# Patient Record
Sex: Male | Born: 1940 | Race: Black or African American | Hispanic: No | Marital: Married | Smoking: Former smoker
Health system: Southern US, Community
[De-identification: ages and names within clinical notes are randomized; demographics above are authoritative.]

## PROBLEM LIST (undated history)

## (undated) DIAGNOSIS — I209 Angina pectoris, unspecified: Secondary | ICD-10-CM

## (undated) DIAGNOSIS — I10 Essential (primary) hypertension: Secondary | ICD-10-CM

## (undated) DIAGNOSIS — I639 Cerebral infarction, unspecified: Secondary | ICD-10-CM

## (undated) DIAGNOSIS — E785 Hyperlipidemia, unspecified: Secondary | ICD-10-CM

## (undated) DIAGNOSIS — I2510 Atherosclerotic heart disease of native coronary artery without angina pectoris: Secondary | ICD-10-CM

## (undated) SURGERY — Surgical Case
Anesthesia: *Unknown

---

## 2010-10-15 ENCOUNTER — Emergency Department (HOSPITAL_COMMUNITY)
Admission: EM | Admit: 2010-10-15 | Discharge: 2010-10-16 | Disposition: A | Discharge status: Home / Self Care | Payer: Medicare Other | Attending: Emergency Medicine | Admitting: Emergency Medicine

## 2010-10-15 DIAGNOSIS — R109 Unspecified abdominal pain: Secondary | ICD-10-CM | POA: Insufficient documentation

## 2010-10-15 DIAGNOSIS — Z79899 Other long term (current) drug therapy: Secondary | ICD-10-CM | POA: Insufficient documentation

## 2010-10-15 DIAGNOSIS — E119 Type 2 diabetes mellitus without complications: Secondary | ICD-10-CM | POA: Insufficient documentation

## 2010-10-15 DIAGNOSIS — I10 Essential (primary) hypertension: Secondary | ICD-10-CM | POA: Insufficient documentation

## 2010-10-15 DIAGNOSIS — Z87891 Personal history of nicotine dependence: Secondary | ICD-10-CM | POA: Insufficient documentation

## 2010-10-15 LAB — URINALYSIS, ROUTINE W REFLEX MICROSCOPIC
Protein, ur: NEGATIVE mg/dL
Urobilinogen, UA: 1 mg/dL (ref 0.0–1.0)

## 2010-10-15 LAB — URINE MICROSCOPIC-ADD ON

## 2010-10-16 ENCOUNTER — Emergency Department (HOSPITAL_COMMUNITY): Payer: Medicare Other

## 2010-10-16 LAB — COMPREHENSIVE METABOLIC PANEL
Alkaline Phosphatase: 56 U/L (ref 39–117)
BUN: 13 mg/dL (ref 6–23)
GFR calc Af Amer: >60 mL/min (ref >60)
GFR calc non Af Amer: >60 mL/min (ref >60)
Glucose, Bld: 127 mg/dL — ABNORMAL HIGH (ref 70–99)
Potassium: 3.6 mEq/L (ref 3.5–5.1)
Total Protein: 7.3 g/dL (ref 6.0–8.3)

## 2010-10-16 LAB — POCT I-STAT, CHEM 8
BUN: 14 mg/dL (ref 6–23)
Creatinine, Ser: 0.9 mg/dL (ref 0.50–1.35)
Potassium: 3.6 mEq/L (ref 3.5–5.1)
Sodium: 137 mEq/L (ref 135–145)

## 2010-10-16 LAB — LIPASE, BLOOD: Lipase: 43 U/L (ref 11–59)

## 2010-10-16 LAB — DIFFERENTIAL
Basophils Absolute: 0 10*3/uL (ref 0.0–0.1)
Lymphocytes Relative: 33 % (ref 12–46)
Monocytes Absolute: 1.3 10*3/uL — ABNORMAL HIGH (ref 0.1–1.0)
Monocytes Relative: 24 % — ABNORMAL HIGH (ref 3–12)
Neutro Abs: 2.3 10*3/uL (ref 1.7–7.7)

## 2010-10-16 LAB — CBC
HCT: 44.1 % (ref 39.0–52.0)
Hemoglobin: 14.4 g/dL (ref 13.0–17.0)
MCH: 27.2 pg (ref 26.0–34.0)
MCHC: 32.7 g/dL (ref 30.0–36.0)

## 2010-10-16 MED ORDER — IOHEXOL 300 MG/ML  SOLN
100.0000 mL | Freq: Once | INTRAMUSCULAR | Status: AC | PRN
  Administered 2010-10-16: 100 mL via INTRAVENOUS

## 2011-02-26 ENCOUNTER — Other Ambulatory Visit: Payer: Self-pay

## 2011-02-26 ENCOUNTER — Emergency Department (HOSPITAL_COMMUNITY): Payer: Medicare Other

## 2011-02-26 ENCOUNTER — Encounter (HOSPITAL_COMMUNITY): Payer: Self-pay | Admitting: Emergency Medicine

## 2011-02-26 ENCOUNTER — Inpatient Hospital Stay (HOSPITAL_COMMUNITY)
Admission: EM | Admit: 2011-02-26 | Discharge: 2011-03-08 | DRG: 234 | Disposition: A | Discharge status: Home Health | Payer: Medicare Other | Attending: Surgery | Admitting: Surgery

## 2011-02-26 DIAGNOSIS — R066 Hiccough: Secondary | ICD-10-CM | POA: Diagnosis not present

## 2011-02-26 DIAGNOSIS — Z79899 Other long term (current) drug therapy: Secondary | ICD-10-CM

## 2011-02-26 DIAGNOSIS — E119 Type 2 diabetes mellitus without complications: Secondary | ICD-10-CM | POA: Diagnosis present

## 2011-02-26 DIAGNOSIS — I214 Non-ST elevation (NSTEMI) myocardial infarction: Principal | ICD-10-CM | POA: Diagnosis present

## 2011-02-26 DIAGNOSIS — K5900 Constipation, unspecified: Secondary | ICD-10-CM | POA: Diagnosis not present

## 2011-02-26 DIAGNOSIS — I10 Essential (primary) hypertension: Secondary | ICD-10-CM | POA: Diagnosis present

## 2011-02-26 DIAGNOSIS — I200 Unstable angina: Secondary | ICD-10-CM

## 2011-02-26 DIAGNOSIS — Z8673 Personal history of transient ischemic attack (TIA), and cerebral infarction without residual deficits: Secondary | ICD-10-CM

## 2011-02-26 DIAGNOSIS — I4891 Unspecified atrial fibrillation: Secondary | ICD-10-CM | POA: Diagnosis not present

## 2011-02-26 DIAGNOSIS — I498 Other specified cardiac arrhythmias: Secondary | ICD-10-CM | POA: Diagnosis not present

## 2011-02-26 DIAGNOSIS — I2510 Atherosclerotic heart disease of native coronary artery without angina pectoris: Secondary | ICD-10-CM

## 2011-02-26 DIAGNOSIS — E785 Hyperlipidemia, unspecified: Secondary | ICD-10-CM | POA: Diagnosis present

## 2011-02-26 DIAGNOSIS — Z7982 Long term (current) use of aspirin: Secondary | ICD-10-CM

## 2011-02-26 DIAGNOSIS — R079 Chest pain, unspecified: Secondary | ICD-10-CM

## 2011-02-26 DIAGNOSIS — Z8249 Family history of ischemic heart disease and other diseases of the circulatory system: Secondary | ICD-10-CM

## 2011-02-26 DIAGNOSIS — Z87891 Personal history of nicotine dependence: Secondary | ICD-10-CM

## 2011-02-26 HISTORY — DX: Angina pectoris, unspecified: I20.9

## 2011-02-26 HISTORY — DX: Cerebral infarction, unspecified: I63.9

## 2011-02-26 HISTORY — DX: Essential (primary) hypertension: I10

## 2011-02-26 LAB — URINALYSIS, ROUTINE W REFLEX MICROSCOPIC
Glucose, UA: NEGATIVE mg/dL
Leukocytes, UA: NEGATIVE
Nitrite: NEGATIVE
Protein, ur: NEGATIVE mg/dL
pH: 7 (ref 5.0–8.0)

## 2011-02-26 LAB — CBC
Hemoglobin: 14 g/dL (ref 13.0–17.0)
MCHC: 32.6 g/dL (ref 30.0–36.0)
Platelets: 190 10*3/uL (ref 150–400)
RDW: 13.3 % (ref 11.5–15.5)

## 2011-02-26 LAB — COMPREHENSIVE METABOLIC PANEL
AST: 21 U/L (ref 0–37)
Albumin: 3.6 g/dL (ref 3.5–5.2)
Alkaline Phosphatase: 69 U/L (ref 39–117)
Chloride: 101 mEq/L (ref 96–112)
Potassium: 4.5 mEq/L (ref 3.5–5.1)
Sodium: 137 mEq/L (ref 135–145)
Total Bilirubin: 0.3 mg/dL (ref 0.3–1.2)
Total Protein: 7.5 g/dL (ref 6.0–8.3)

## 2011-02-26 LAB — CARDIAC PANEL(CRET KIN+CKTOT+MB+TROPI): Relative Index: 2.8 — ABNORMAL HIGH (ref 0.0–2.5)

## 2011-02-26 LAB — DIFFERENTIAL
Basophils Absolute: 0 10*3/uL (ref 0.0–0.1)
Basophils Relative: 0 % (ref 0–1)
Monocytes Relative: 14 % — ABNORMAL HIGH (ref 3–12)
Neutro Abs: 3.4 10*3/uL (ref 1.7–7.7)
Neutrophils Relative %: 57 % (ref 43–77)

## 2011-02-26 LAB — POCT I-STAT TROPONIN I: Troponin i, poc: 0.13 ng/mL (ref 0.00–0.08)

## 2011-02-26 MED ORDER — ASPIRIN 81 MG PO CHEW
324.0000 mg | CHEWABLE_TABLET | Freq: Once | ORAL | Status: AC
  Administered 2011-02-26: 324 mg via ORAL
  Filled 2011-02-26: qty 4

## 2011-02-26 MED ORDER — ASPIRIN EC 81 MG PO TBEC
81.0000 mg | DELAYED_RELEASE_TABLET | Freq: Every day | ORAL | Status: DC
  Administered 2011-02-27 – 2011-03-01 (×2): 81 mg via ORAL
  Filled 2011-02-26 (×3): qty 1

## 2011-02-26 MED ORDER — ACETAMINOPHEN 325 MG PO TABS: 650.0000 mg | ORAL_TABLET | ORAL | Status: DC | PRN

## 2011-02-26 MED ORDER — NITROGLYCERIN 0.4 MG SL SUBL: 0.4000 mg | SUBLINGUAL_TABLET | SUBLINGUAL | Status: DC | PRN

## 2011-02-26 MED ORDER — HEPARIN BOLUS VIA INFUSION
4000.0000 [IU] | Freq: Once | INTRAVENOUS | Status: AC
  Administered 2011-02-26: 4000 [IU] via INTRAVENOUS

## 2011-02-26 MED ORDER — METOPROLOL TARTRATE 12.5 MG HALF TABLET
12.5000 mg | ORAL_TABLET | Freq: Two times a day (BID) | ORAL | Status: DC
  Administered 2011-02-26 – 2011-02-28 (×4): 12.5 mg via ORAL
  Filled 2011-02-26 (×7): qty 1

## 2011-02-26 MED ORDER — SIMVASTATIN 20 MG PO TABS
20.0000 mg | ORAL_TABLET | Freq: Every day | ORAL | Status: DC
  Administered 2011-02-27 – 2011-02-28 (×2): 20 mg via ORAL
  Filled 2011-02-26 (×3): qty 1

## 2011-02-26 MED ORDER — ONDANSETRON HCL 4 MG/2ML IJ SOLN: 4.0000 mg | Freq: Four times a day (QID) | INTRAMUSCULAR | Status: DC | PRN

## 2011-02-26 MED ORDER — HEPARIN (PORCINE) IN NACL 100-0.45 UNIT/ML-% IJ SOLN
1000.0000 [IU]/h | INTRAMUSCULAR | Status: DC
  Administered 2011-02-26: 1000 [IU]/h via INTRAVENOUS
  Filled 2011-02-26 (×2): qty 250

## 2011-02-26 NOTE — ED Notes (Signed)
PT states he took one 81 mg ASA this morning. Pt has been in the process of moving and CP started yesterday while picking up items for the move.

## 2011-02-26 NOTE — ED Provider Notes (Signed)
History     CSN: [id number]  Arrival date & time 02/26/11  1919   First MD Initiated Contact with Patient 02/26/11 1951      Chief Complaint  Patient presents with  . Chest Pain  . Abdominal Pain    (Consider location/radiation/quality/duration/timing/severity/associated sxs/prior treatment) Patient is a 71 y.o. male presenting with chest pain. The history is provided by the patient.  Chest Pain The chest pain began 1 - 2 weeks ago. Duration of episode(s) is 30 minutes. Chest pain occurs intermittently. The chest pain is worsening. The pain is associated with exertion. At its most intense, the pain is at 7/10. The pain is currently at 0/10. The severity of the pain is moderate. The quality of the pain is described as aching, dull and heavy. The pain radiates to the epigastrium. Chest pain is worsened by exertion. Primary symptoms include abdominal pain. Pertinent negatives for primary symptoms include no fatigue, no shortness of breath, no cough, no wheezing, no nausea and no vomiting.  Pertinent negatives for associated symptoms include no lower extremity edema. He tried nothing for the symptoms. Risk factors include male gender and smoking/tobacco exposure.  His past medical history is significant for diabetes, hyperlipidemia and hypertension.  His family medical history is significant for heart disease in family.  Procedure history is negative for cardiac catheterization.     Past Medical History  Diagnosis Date  . Diabetes mellitus   . Hypertension   . Stroke     History reviewed. No pertinent past surgical history.  No family history on file.  History  Substance Use Topics  . Smoking status: Former Games developer  . Smokeless tobacco: Not on file  . Alcohol Use:       Review of Systems  Constitutional: Negative for fatigue.  Respiratory: Negative for cough, shortness of breath and wheezing.   Cardiovascular: Positive for chest pain.  Gastrointestinal: Positive for  abdominal pain. Negative for nausea and vomiting.  All other systems reviewed and are negative.    Allergies  Review of patient's allergies indicates no known allergies.  Home Medications   Current Outpatient Rx  Name Route Sig Dispense Refill  . METFORMIN HCL 1000 MG PO TABS Oral Take 1,000 mg by mouth daily.      BP 174/89  Pulse 99  Temp 98.2 F (36.8 C)  Resp 16  SpO2 100%  Physical Exam  Nursing note and vitals reviewed. Constitutional: He is oriented to person, place, and time. He appears well-developed and well-nourished. No distress.  HENT:  Head: Normocephalic and atraumatic.  Mouth/Throat: Oropharynx is clear and moist.  Eyes: Conjunctivae and EOM are normal. Pupils are equal, round, and reactive to light.  Neck: Normal range of motion. Neck supple.  Cardiovascular: Normal rate, regular rhythm and intact distal pulses.   No murmur heard. Pulmonary/Chest: Effort normal and breath sounds normal. No respiratory distress. He has no wheezes. He has no rales.  Abdominal: Soft. He exhibits no distension. There is no tenderness. There is no rebound and no guarding.  Musculoskeletal: Normal range of motion. He exhibits no edema and no tenderness.  Neurological: He is alert and oriented to person, place, and time.  Skin: Skin is warm and dry. No rash noted. No erythema.  Psychiatric: He has a normal mood and affect. His behavior is normal.    ED Course  Procedures (including critical care time)  Labs Reviewed  DIFFERENTIAL - Abnormal; Notable for the following:    Monocytes Relative 14 (*)  All other components within normal limits  COMPREHENSIVE METABOLIC PANEL - Abnormal; Notable for the following:    Glucose, Bld 122 (*)    All other components within normal limits  CARDIAC PANEL(CRET KIN+CKTOT+MB+TROPI) - Abnormal; Notable for the following:    CK, MB 5.3 (*)    Relative Index 2.8 (*)    All other components within normal limits  POCT I-STAT TROPONIN I -  Abnormal; Notable for the following:    Troponin i, poc 0.13 (*)    All other components within normal limits  CBC  URINALYSIS, ROUTINE W REFLEX MICROSCOPIC   Dg Abd Acute W/chest  02/26/2011  *RADIOLOGY REPORT*  Clinical Data: Generalized chest and abdominal pain.  ACUTE ABDOMEN SERIES (ABDOMEN 2 VIEW & CHEST 1 VIEW)  Comparison: None.  Findings: The heart size is at the upper limits of normal.  The lungs are clear.  Supine and upright views the abdomen demonstrates moderate stool throughout the colon.  No obstruction or free air is present.  Degenerative changes are present in the lower lumbar spine.  IMPRESSION:  1.  Borderline cardiomegaly without failure. 2.  Moderate stool throughout the colon. 3.  No acute abnormality of the chest or abdomen.  Original Report Authenticated By: Jamesetta Orleans. MATTERN, M.D.   CRITICAL CARE Performed by: Gwyneth Sprout   Total critical care time: 30  Critical care time was exclusive of separately billable procedures and treating other patients.  Critical care was necessary to treat or prevent imminent or life-threatening deterioration.  Critical care was time spent personally by me on the following activities: development of treatment plan with patient and/or surrogate as well as nursing, discussions with consultants, evaluation of patient's response to treatment, examination of patient, obtaining history from patient or surrogate, ordering and performing treatments and interventions, ordering and review of laboratory studies, ordering and review of radiographic studies, pulse oximetry and re-evaluation of patient's condition.   No diagnosis found.   Date: 02/26/2011  Rate: 89  Rhythm: normal sinus rhythm  QRS Axis: normal  Intervals: normal  ST/T Wave abnormalities: normal  Conduction Disutrbances:none  Narrative Interpretation:   Old EKG Reviewed: none available    MDM   Patient presenting with a story concerning for ACS. He is getting  chest pain when he gets up and exerts himself. Which is in his chest and his abdomen. He denies any associated symptoms and currently is pain-free. No infectious symptoms no abdominal pain and normal exam currently. His EKG is with in normal limits. Chest x-ray within normal limits. CBC, CMP within normal limits. Point-of-care troponin elevated at 0.13 but regular her cardiac markers with an elevated CK-MB of 5.3 but otherwise normal. Given the patient's story concerning for unstable angina. Multiple risks factors concerning for cardiac disease. Patient given aspirin and started on heparin as there are no contraindications to giving blood thinners. Cardiology consult and will admit for further care.       Gwyneth Sprout, MD 02/26/11 2136

## 2011-02-26 NOTE — ED Notes (Signed)
Patient states that chest pain and abdominal pain started yesterday, worse upon exertion.  No shortness of breath, no nausea or vomiting.

## 2011-02-26 NOTE — ED Notes (Signed)
MD at bedside. 

## 2011-02-26 NOTE — Progress Notes (Signed)
ANTICOAGULATION CONSULT NOTE - Initial Consult  Pharmacy Consult for Heparin Indication: chest pain/ACS  No Known Allergies  Patient Measurements: Height: 6' (182.9 cm) Weight: 186 lb 15.2 oz (84.8 kg) IBW/kg (Calculated) : 77.6   Vital Signs: Temp: 98.1 F (36.7 C) (02/02 2332) Temp src: Oral (02/02 2332) BP: 160/91 mmHg (02/02 2332) Pulse Rate: 80  (02/02 2332)  Labs:  Basename 02/26/11 2011 02/26/11 2010  HGB -- 14.0  HCT -- 43.0  PLT -- 190  APTT -- --  LABPROT -- --  INR -- --  HEPARINUNFRC -- --  CREATININE -- 0.69  CKTOTAL 186 --  CKMB 5.3* --  TROPONINI <0.30 --   Estimated Creatinine Clearance: 93 ml/min (by C-G formula based on Cr of 0.69).  Medical History: Past Medical History  Diagnosis Date  . Diabetes mellitus   . Hypertension   . Stroke   . Angina     Medications:  Prescriptions prior to admission  Medication Sig Dispense Refill  . metFORMIN (GLUCOPHAGE) 1000 MG tablet Take 1,000 mg by mouth daily.        Assessment: 71 y.o. Male presents with chest pain. Troponon negative. Pt started heparin gtt in ED with 4000 unit bolus and 1000 units/hr at ~2200  Goal of Therapy:  Heparin level 0.3-0.7 units/ml   Plan:  1. Continue heparin at 1000 units/hr 2. Will  Check 8 hr heparin level 3. Daily heparin level and CBC  Henery Betzold, Hilario Quarry, PharmD, BCPS Clinical pharmacist; pager [phone] 02/26/2011,11:54 PM

## 2011-02-27 ENCOUNTER — Encounter (HOSPITAL_COMMUNITY): Payer: Self-pay | Admitting: Internal Medicine

## 2011-02-27 DIAGNOSIS — R079 Chest pain, unspecified: Secondary | ICD-10-CM

## 2011-02-27 LAB — GLUCOSE, CAPILLARY: Glucose-Capillary: 172 mg/dL — ABNORMAL HIGH (ref 70–99)

## 2011-02-27 LAB — CBC
HCT: 44.1 % (ref 39.0–52.0)
Hemoglobin: 14.1 g/dL (ref 13.0–17.0)
MCH: 27.6 pg (ref 26.0–34.0)
MCV: 86.5 fL (ref 78.0–100.0)
RBC: 5.1 MIL/uL (ref 4.22–5.81)

## 2011-02-27 LAB — PROTIME-INR
INR: 1.05 (ref 0.00–1.49)
Prothrombin Time: 13.9 seconds (ref 11.6–15.2)

## 2011-02-27 LAB — PLATELET INHIBITION P2Y12: Platelet Function  P2Y12: 321 [PRU] (ref 194–418)

## 2011-02-27 LAB — BASIC METABOLIC PANEL
BUN: 8 mg/dL (ref 6–23)
Calcium: 9.2 mg/dL (ref 8.4–10.5)
GFR calc Af Amer: >90 mL/min (ref >90)
GFR calc non Af Amer: >90 mL/min (ref >90)
Glucose, Bld: 182 mg/dL — ABNORMAL HIGH (ref 70–99)
Potassium: 4.1 mEq/L (ref 3.5–5.1)

## 2011-02-27 LAB — LIPID PANEL
Cholesterol: 197 mg/dL (ref 0–200)
LDL Cholesterol: 138 mg/dL — ABNORMAL HIGH (ref 0–99)
Triglycerides: 94 mg/dL (ref <150)
VLDL: 17 mg/dL (ref 0–40)

## 2011-02-27 LAB — CARDIAC PANEL(CRET KIN+CKTOT+MB+TROPI)
CK, MB: 6 ng/mL — ABNORMAL HIGH (ref 0.3–4.0)
CK, MB: 6.3 ng/mL (ref 0.3–4.0)
Troponin I: 0.38 ng/mL (ref <0.30)

## 2011-02-27 LAB — HEMOGLOBIN A1C
Hgb A1c MFr Bld: 5.9 % — ABNORMAL HIGH (ref <5.7)
Mean Plasma Glucose: 123 mg/dL — ABNORMAL HIGH (ref <117)

## 2011-02-27 LAB — APTT: aPTT: 67 seconds — ABNORMAL HIGH (ref 24–37)

## 2011-02-27 MED ORDER — CAPTOPRIL 6.25 MG HALF TABLET
6.2500 mg | ORAL_TABLET | Freq: Three times a day (TID) | ORAL | Status: DC
  Administered 2011-02-27 – 2011-03-01 (×6): 6.25 mg via ORAL
  Filled 2011-02-27 (×10): qty 1

## 2011-02-27 MED ORDER — SODIUM CHLORIDE 0.9 % IJ SOLN: 3.0000 mL | INTRAMUSCULAR | Status: DC | PRN

## 2011-02-27 MED ORDER — SODIUM CHLORIDE 0.9 % IV SOLN
INTRAVENOUS | Status: DC
  Administered 2011-02-28: 75 mL/h via INTRAVENOUS

## 2011-02-27 MED ORDER — HEPARIN (PORCINE) IN NACL 100-0.45 UNIT/ML-% IJ SOLN
1350.0000 [IU]/h | INTRAMUSCULAR | Status: DC
  Administered 2011-02-27 (×2): 1350 [IU]/h via INTRAVENOUS
  Filled 2011-02-27 (×2): qty 250

## 2011-02-27 MED ORDER — HEPARIN BOLUS VIA INFUSION
2500.0000 [IU] | Freq: Once | INTRAVENOUS | Status: AC
  Administered 2011-02-27: 2500 [IU] via INTRAVENOUS
  Filled 2011-02-27: qty 2500

## 2011-02-27 MED ORDER — SODIUM CHLORIDE 0.9 % IJ SOLN
3.0000 mL | Freq: Two times a day (BID) | INTRAMUSCULAR | Status: DC
  Administered 2011-02-27 (×2): 3 mL via INTRAVENOUS

## 2011-02-27 MED ORDER — NITROGLYCERIN IN D5W 200-5 MCG/ML-% IV SOLN
2.0000 ug/min | INTRAVENOUS | Status: DC
  Administered 2011-02-27: 5 ug/min via INTRAVENOUS

## 2011-02-27 MED ORDER — SODIUM CHLORIDE 0.9 % IV SOLN
250.0000 mL | INTRAVENOUS | Status: DC | PRN
  Administered 2011-02-27: 250 mL via INTRAVENOUS

## 2011-02-27 MED ORDER — HEPARIN BOLUS VIA INFUSION
2000.0000 [IU] | Freq: Once | INTRAVENOUS | Status: AC
  Administered 2011-02-27: 2000 [IU] via INTRAVENOUS

## 2011-02-27 MED ORDER — ACTIVE PARTNERSHIP FOR HEALTH OF YOUR HEART BOOK
Freq: Once | Status: AC
  Administered 2011-02-27: 22:00:00
  Filled 2011-02-27: qty 1

## 2011-02-27 MED ORDER — ASPIRIN 81 MG PO CHEW
324.0000 mg | CHEWABLE_TABLET | ORAL | Status: AC
  Administered 2011-02-28: 324 mg via ORAL
  Filled 2011-02-27: qty 4

## 2011-02-27 MED ORDER — DIAZEPAM 5 MG PO TABS
5.0000 mg | ORAL_TABLET | ORAL | Status: AC
  Administered 2011-02-28: 5 mg via ORAL
  Filled 2011-02-27: qty 1

## 2011-02-27 MED ORDER — HEPARIN (PORCINE) IN NACL 100-0.45 UNIT/ML-% IJ SOLN
1650.0000 [IU]/h | INTRAMUSCULAR | Status: DC
  Filled 2011-02-27: qty 250

## 2011-02-27 NOTE — Progress Notes (Signed)
ANTICOAGULATION CONSULT NOTE - Follow Up Consult  Pharmacy Consult for Heparin Indication: chest pain/ACS  No Known Allergies  Patient Measurements: Height: 6' (182.9 cm) Weight: 186 lb 1.1 oz (84.4 kg) IBW/kg (Calculated) : 77.6  Heparin Dosing Weight: 85kg  Vital Signs: Temp: 98.3 F (36.8 C) (02/03 2000) Temp src: Oral (02/03 2000) BP: 121/61 mmHg (02/03 1900) Pulse Rate: 86  (02/03 1900)  Labs:  Basename 02/27/11 2030 02/27/11 0917 02/26/11 2338 02/26/11 2011 02/26/11 2010  HGB -- 14.1 -- -- 14.0  HCT -- 44.1 -- -- 43.0  PLT -- 175 -- -- 190  APTT -- -- 67* -- --  LABPROT -- 14.1 13.9 -- --  INR -- 1.07 1.05 -- --  HEPARINUNFRC 0.16* <0.10* -- -- --  CREATININE -- 0.65 -- -- 0.69  CKTOTAL -- 217 188 186 --  CKMB -- 6.3* 6.0* 5.3* --  TROPONINI -- 0.41* 0.38* <0.30 --   Estimated Creatinine Clearance: 93 ml/min (by C-G formula based on Cr of 0.65).   Medications:  Heparin 1350 units/hr  Assessment: 70yom on heparin for CP awaiting cath tomorrow. Heparin level (0.16) is subtherapeutic but trended up. RN reports no problems with IV/infusion - H/H and Plts wnl - No significant bleeding reported  Goal of Therapy:  Heparin level 0.3-0.7 units/ml   Plan:  1. Heparin IV bolus 2000 units x 1 2. Increase heparin drip to 1650 units/hr (16.5 ml/hr) 3. Check heparin level ~ 8hrs after rate increase (with AM labs)  Cleon Dew [phone] 02/27/2011,9:41 PM

## 2011-02-27 NOTE — Progress Notes (Signed)
CRITICAL VALUE ALERT  Critical value received: troponin 0.38  Date of notification: 02/27/2011  Time of notification: 0156  Critical value read back:yes  Nurse who received alert: Zsofia Prout rn  MD notified (1st page):[email or web address]  Time of first page:0205  MD notified (2nd page):  Time of second page:  Responding MD: dr. Tresa Endo  Time MD responded:0210

## 2011-02-27 NOTE — Progress Notes (Signed)
ANTICOAGULATION CONSULT NOTE - Follow Up Consult  Pharmacy Consult for heparin Indication: chest pain/ACS  No Known Allergies  Patient Measurements: Height: 6' (182.9 cm) Weight: 186 lb 15.2 oz (84.8 kg) IBW/kg (Calculated) : 77.6  Heparin Dosing Weight: 85 kg  Vital Signs: Temp: 97.5 F (36.4 C) (02/03 0816) Temp src: Oral (02/03 0816) BP: 132/73 mmHg (02/03 1100) Pulse Rate: 91  (02/03 1100)  Labs:  Basename 02/27/11 0917 02/26/11 2338 02/26/11 2011 02/26/11 2010  HGB 14.1 -- -- 14.0  HCT 44.1 -- -- 43.0  PLT 175 -- -- 190  APTT -- 67* -- --  LABPROT 14.1 13.9 -- --  INR 1.07 1.05 -- --  HEPARINUNFRC <0.10* -- -- --  CREATININE 0.65 -- -- 0.69  CKTOTAL 217 188 186 --  CKMB 6.3* 6.0* 5.3* --  TROPONINI 0.41* 0.38* <0.30 --   Estimated Creatinine Clearance: 93 ml/min (by C-G formula based on Cr of 0.65).   Medications:  Scheduled:    . aspirin  324 mg Oral Once  . aspirin  324 mg Oral Pre-Cath  . aspirin EC  81 mg Oral Daily  . captopril  6.25 mg Oral TID  . diazepam  5 mg Oral On Call  . heparin  4,000 Units Intravenous Once  . metoprolol tartrate  12.5 mg Oral BID  . simvastatin  20 mg Oral q1800  . sodium chloride  3 mL Intravenous Q12H    Assessment: 70 YOM admitted with CP, started on heparin gtt per Rx.  Initial heparin level  Is undetectable,  CBC stable, and no bleeding noted.   Goal of Therapy:  Heparin level 0.3-0.7 units/ml   Plan:  1. Heparin 2500 unit bolus then inc rate 1350 units/hr with ~8h heparin level  Loletha Bertini, Tad Moore 02/27/2011,11:35 AM  .

## 2011-02-27 NOTE — Progress Notes (Signed)
SUBJECTIVE:  Had more chest pain this am but mild  OBJECTIVE:   Vitals:   Filed Vitals:   02/27/11 0000 02/27/11 0400 02/27/11 0500 02/27/11 0816  BP: 162/95 135/84  148/77  Pulse: 70 70  73  Temp:  97.7 F (36.5 C)  97.5 F (36.4 C)  TempSrc:  Oral  Oral  Resp: 20 15  15  Height:      Weight:   84.8 kg (186 lb 15.2 oz)   SpO2: 98% 97%  99%   I&O's:   Intake/Output Summary (Last 24 hours) at 02/27/11 0843 Last data filed at 02/27/11 0821  Gross per 24 hour  Intake    200 ml  Output    400 ml  Net   -200 ml   TELEMETRY: Reviewed telemetry pt in NSR     PHYSICAL EXAM General: Well developed, well nourished, in no acute distress Head: Eyes PERRLA, No xanthomas.   Normal cephalic and atramatic  Lungs:   Clear bilaterally to auscultation and percussion. Heart:   HRRR S1 S2 Pulses are 2+ & equal.            No carotid bruit. No JVD.  No abdominal bruits. No femoral bruits. Abdomen: Bowel sounds are positive, abdomen soft and non-tender without masses or                  Hernia's noted. Msk:  Back normal, normal gait. Normal strength and tone for age. Extremities:   No clubbing, cyanosis or edema.  DP +1 Neuro: Alert and oriented X 3. Psych:  Good affect, responds appropriately   LABS: Basic Metabolic Panel:  Basename 02/26/11 2010  NA 137  K 4.5  CL 101  CO2 29  GLUCOSE 122*  BUN 10  CREATININE 0.69  CALCIUM 9.2  MG --  PHOS --   Liver Function Tests:  Basename 02/26/11 2010  AST 21  ALT 39  ALKPHOS 69  BILITOT 0.3  PROT 7.5  ALBUMIN 3.6    CBC:  Basename 02/26/11 2010  WBC 6.0  NEUTROABS 3.4  HGB 14.0  HCT 43.0  MCV 86.0  PLT 190   Cardiac Enzymes:  Basename 02/26/11 2338 02/26/11 2011  CKTOTAL 188 186  CKMB 6.0* 5.3*  CKMBINDEX -- --  TROPONINI 0.38* <0.30   Fasting Lipid Panel:  Basename 02/27/11 0425  CHOL 197  HDL 41  LDLCALC 137*  TRIG 94  CHOLHDL 4.8  LDLDIRECT --   Coag Panel:   Lab Results  Component Value Date   INR 1.05 02/26/2011    RADIOLOGY: Dg Abd Acute W/chest  02/26/2011  *RADIOLOGY REPORT*  Clinical Data: Generalized chest and abdominal pain.  ACUTE ABDOMEN SERIES (ABDOMEN 2 VIEW & CHEST 1 VIEW)  Comparison: None.  Findings: The heart size is at the upper limits of normal.  The lungs are clear.  Supine and upright views the abdomen demonstrates moderate stool throughout the colon.  No obstruction or free air is present.  Degenerative changes are present in the lower lumbar spine.  IMPRESSION:  1.  Borderline cardiomegaly without failure. 2.  Moderate stool throughout the colon. 3.  No acute abnormality of the chest or abdomen.  Original Report Authenticated By: CHRISTOPHER W. MATTERN, M.D.      ASSESSMENT:  1.  USAP with small enzyme leak c/w NSTEMI 2.  DM 3.  HTN 4.  CVA   PLAN:   1.  NPO after midnight 2.  Cardiac cath in am 3.    Start IV NTG gtt for chest pain 4.  Hold Metformin 5.  Continue IV Heparin gtt 6.  Continue ASA/ACE I/beta blocker/statin Cardiac catheterization was discussed with the patient fully including risks on myocardial infarction, death, stroke, bleeding, arrhythmia, dye allergy, renal insufficiency or bleeding.  All patient questions and concerns were discussed and the patient understands and is willing to proceed.     TURNER,TRACI R, MD  02/27/2011  8:43 AM   

## 2011-02-27 NOTE — Progress Notes (Signed)
Pt and wife viewed cath/PCI/stent video. All questions answered. [employer]

## 2011-02-27 NOTE — H&P (Signed)
Joel Santiago is an 71 y.o. male.   Chief Complaint: chest pain HPI: 33 man with PMH of HTN, HLD, T2DM on metformin who comes in with chest pain. Mr. Joel Santiago tells me he has been moving and he keeps having to stop picking up/moving boxes because of chest pain that is relieved by sitting down and resting for several minutes. He has no associated nausea/vomiting, pain is substernal, pressure in quality, no radiation to left chest/neck/jaw. He tells me he has had chest pain for several years that comes on with exertion; however, he can ride a bicycle for 2-3 miles (leisurely) without the onset of chest pain. He has been a diabetic for several years and is currently taking 500 mg metformin bid. When I described a heart catheterization to him he told me he had one of those at [facility] in 2005 or 2006 and he did not have a stent at that time. He tells me he also had a stress test at that time that was very challenging! He received 4 baby aspirin and was started on heparin gtt in the ER. POC troponin 0.13 but initial lab troponin negative with CKMB 5.3.   Past Medical History  Diagnosis Date  . Diabetes mellitus   . Hypertension   . Stroke   . Angina     History reviewed. No pertinent past surgical history.  History reviewed. No pertinent family history. Social History:  reports that he has quit smoking. He has never used smokeless tobacco. He reports that he does not drink alcohol or use illicit drugs. 1ppd x 30 years, quit 13 years ago; no etoh/drugs - he worked in [employer] Family history: mother with CAD in her mid 73s, father died at age 29 of unknown accident  Allergies: No Known Allergies  Medications Prior to Admission  Medication Dose Route Frequency Provider Last Rate Last Dose  . acetaminophen (TYLENOL) tablet 650 mg  650 mg Oral Q4H PRN Leeann Must, MD      . aspirin chewable tablet 324 mg  324 mg Oral Once Gwyneth Sprout, MD   324 mg at 02/26/11 2132  . aspirin EC  tablet 81 mg  81 mg Oral Daily Leeann Must, MD      . heparin ADULT infusion 100 units/mL (25000 units/250 mL)  1,000 Units/hr Intravenous Continuous Gwyneth Sprout, MD 10 mL/hr at 02/26/11 2159 1,000 Units/hr at 02/26/11 2159  . heparin bolus via infusion 4,000 Units  4,000 Units Intravenous Once Gwyneth Sprout, MD   4,000 Units at 02/26/11 2157  . metoprolol tartrate (LOPRESSOR) tablet 12.5 mg  12.5 mg Oral BID Leeann Must, MD   12.5 mg at 02/26/11 2358  . nitroGLYCERIN (NITROSTAT) SL tablet 0.4 mg  0.4 mg Sublingual Q5 Min x 3 PRN Leeann Must, MD      . ondansetron [facility]) injection 4 mg  4 mg Intravenous Q6H PRN Leeann Must, MD      . simvastatin (ZOCOR) tablet 20 mg  20 mg Oral q1800 Leeann Must, MD       No current outpatient prescriptions on file as of 02/27/2011.    Results for orders placed during the hospital encounter of 02/26/11 (from the past 48 hour(s))  CBC     Status: Normal   Collection Time   02/26/11  8:10 PM      Component Value Range Comment   WBC 6.0  4.0 - 10.5 (K/uL)    RBC 5.00  4.22 - 5.81 (MIL/uL)  Hemoglobin 14.0  13.0 - 17.0 (g/dL)    HCT 16.1  09.6 - 04.5 (%)    MCV 86.0  78.0 - 100.0 (fL)    MCH 28.0  26.0 - 34.0 (pg)    MCHC 32.6  30.0 - 36.0 (g/dL)    RDW 40.9  81.1 - 91.4 (%)    Platelets 190  150 - 400 (K/uL)   DIFFERENTIAL     Status: Abnormal   Collection Time   02/26/11  8:10 PM      Component Value Range Comment   Neutrophils Relative 57  43 - 77 (%)    Neutro Abs 3.4  1.7 - 7.7 (K/uL)    Lymphocytes Relative 27  12 - 46 (%)    Lymphs Abs 1.6  0.7 - 4.0 (K/uL)    Monocytes Relative 14 (*) 3 - 12 (%)    Monocytes Absolute 0.8  0.1 - 1.0 (K/uL)    Eosinophils Relative 2  0 - 5 (%)    Eosinophils Absolute 0.1  0.0 - 0.7 (K/uL)    Basophils Relative 0  0 - 1 (%)    Basophils Absolute 0.0  0.0 - 0.1 (K/uL)   COMPREHENSIVE METABOLIC PANEL     Status: Abnormal   Collection Time   02/26/11  8:10 PM      Component Value Range Comment   Sodium  137  135 - 145 (mEq/L)    Potassium 4.5  3.5 - 5.1 (mEq/L)    Chloride 101  96 - 112 (mEq/L)    CO2 29  19 - 32 (mEq/L)    Glucose, Bld 122 (*) 70 - 99 (mg/dL)    BUN 10  6 - 23 (mg/dL)    Creatinine, Ser 7.82  0.50 - 1.35 (mg/dL)    Calcium 9.2  8.4 - 10.5 (mg/dL)    Total Protein 7.5  6.0 - 8.3 (g/dL)    Albumin 3.6  3.5 - 5.2 (g/dL)    AST 21  0 - 37 (U/L)    ALT 39  0 - 53 (U/L)    Alkaline Phosphatase 69  39 - 117 (U/L)    Total Bilirubin 0.3  0.3 - 1.2 (mg/dL)    GFR calc non Af Amer >90  >90 (mL/min)    GFR calc Af Amer >90  >90 (mL/min)   CARDIAC PANEL(CRET KIN+CKTOT+MB+TROPI)     Status: Abnormal   Collection Time   02/26/11  8:11 PM      Component Value Range Comment   Total CK 186  7 - 232 (U/L)    CK, MB 5.3 (*) 0.3 - 4.0 (ng/mL)    Troponin I <0.30  <0.30 (ng/mL)    Relative Index 2.8 (*) 0.0 - 2.5    POCT I-STAT TROPONIN I     Status: Abnormal   Collection Time   02/26/11  8:20 PM      Component Value Range Comment   Troponin i, poc 0.13 (*) 0.00 - 0.08 (ng/mL)    Comment 3            URINALYSIS, ROUTINE W REFLEX MICROSCOPIC     Status: Normal   Collection Time   02/26/11  9:56 PM      Component Value Range Comment   Color, Urine YELLOW  YELLOW     APPearance CLEAR  CLEAR     Specific Gravity, Urine 1.012  1.005 - 1.030     pH 7.0  5.0 - 8.0  Glucose, UA NEGATIVE  NEGATIVE (mg/dL)    Hgb urine dipstick NEGATIVE  NEGATIVE     Bilirubin Urine NEGATIVE  NEGATIVE     Ketones, ur NEGATIVE  NEGATIVE (mg/dL)    Protein, ur NEGATIVE  NEGATIVE (mg/dL)    Urobilinogen, UA 1.0  0.0 - 1.0 (mg/dL)    Nitrite NEGATIVE  NEGATIVE     Leukocytes, UA NEGATIVE  NEGATIVE  MICROSCOPIC NOT DONE ON URINES WITH NEGATIVE PROTEIN, BLOOD, LEUKOCYTES, NITRITE, OR GLUCOSE <1000 mg/dL.    Review of Systems  Constitutional: Negative for fever, chills and weight loss.  HENT: Negative for hearing loss, neck pain and tinnitus.   Eyes: Negative for blurred vision, double vision and  photophobia.  Respiratory: Negative for cough, hemoptysis and sputum production.   Cardiovascular: Positive for chest pain and palpitations. Negative for orthopnea, claudication and leg swelling.  Gastrointestinal: Negative for heartburn, nausea, vomiting and abdominal pain.  Genitourinary: Negative for dysuria, frequency and hematuria.  Musculoskeletal: Negative for myalgias and back pain.  Skin: Negative for itching and rash.  Neurological: Negative for dizziness, tingling, tremors and headaches.  Endo/Heme/Allergies: Negative for environmental allergies and polydipsia.  Psychiatric/Behavioral: Negative for depression, suicidal ideas and substance abuse.    Blood pressure 160/91, pulse 80, temperature 98.1 F (36.7 C), temperature source Oral, resp. rate 19, height 6' (1.829 m), weight 84.8 kg (186 lb 15.2 oz), SpO2 98.00%. Physical Exam  Nursing note and vitals reviewed. Constitutional: He is oriented to person, place, and time. He appears well-developed and well-nourished. No distress.  HENT:  Head: Normocephalic and atraumatic.  Nose: Nose normal.  Mouth/Throat: Oropharynx is clear and moist. No oropharyngeal exudate.  Eyes: Conjunctivae and EOM are normal. Pupils are equal, round, and reactive to light. No scleral icterus.  Neck: Normal range of motion. Neck supple. No JVD present. No tracheal deviation present. No thyromegaly present.  Cardiovascular: Normal rate, regular rhythm, normal heart sounds and intact distal pulses.  Exam reveals no gallop.   No murmur heard. Respiratory: Effort normal and breath sounds normal. No respiratory distress. He has no wheezes. He has no rales.  GI: Soft. Bowel sounds are normal. He exhibits no distension. There is no tenderness. There is no rebound.  Musculoskeletal: Normal range of motion. He exhibits no edema and no tenderness.  Neurological: He is alert and oriented to person, place, and time. No cranial nerve deficit. Coordination normal.    Skin: Skin is warm and dry. No rash noted. He is not diaphoretic. No erythema.  Psychiatric: He has a normal mood and affect. His behavior is normal.    Labs reviewed in epic; wbc 6, h/h 14/43, plt 190, K 4.5, creatinine 0.7, albumin 3.6, Troponin negative, CKMB 5.3 EKG reviewed; age unknown inferior infarct, NSR POC troponin 0.13 (slightly +) then troponin negative  Chest x-ray: unrevealing Urinalysis: negative  Assessment/Plan Problem List Chest Pain/Unstable Angina Hypertension Hyperlipidemia Prior tobacco use Unknown infection 9/12 treated with outpatient antibiotics  71 yo man with previous hypertension, hyperlipidemia, T2DM on metformin being admitted with unstable angina.   Unstable angina: Patient has a story of stable angina that is now much more frequent and unstable. Risk factors of some family history (slight), significant tobacco, age, male gender and T2DM.  - aspirin 324 mg give, on heparin gtt - patient will need stress imaging vs. Straight coronary angiography  - trend troponins/CKMB, defer clopidogrel given risk of significant 3v CAD in setting of T2DM - flp and hba1c in AM - start low dose  metoprolol with bp/hr holding parameters - risk stratify further with echocardiogram vs. V-gram (if performing angiography then echo not necessary) T2DM: obtain hba1c, holding metformin for any imaging/angiography  Hypertension: hypertensive in ER - start metoprolol 12.5 mg bid - will then add low dose ace- and titrate slowly   Hyperlipidemia: start simvastatin, flp in am       Arilynn Blakeney 02/27/2011, 12:46 AM

## 2011-02-28 ENCOUNTER — Other Ambulatory Visit: Payer: Self-pay

## 2011-02-28 ENCOUNTER — Encounter (HOSPITAL_COMMUNITY): Payer: Self-pay | Admitting: Cardiology

## 2011-02-28 ENCOUNTER — Encounter (HOSPITAL_COMMUNITY)
Admission: EM | Disposition: A | Discharge status: Home Health | Payer: Self-pay | Source: Home / Self Care | Attending: Surgery

## 2011-02-28 ENCOUNTER — Encounter (HOSPITAL_COMMUNITY): Payer: Medicare Other

## 2011-02-28 DIAGNOSIS — I2510 Atherosclerotic heart disease of native coronary artery without angina pectoris: Secondary | ICD-10-CM

## 2011-02-28 DIAGNOSIS — R079 Chest pain, unspecified: Secondary | ICD-10-CM

## 2011-02-28 HISTORY — PX: LEFT HEART CATHETERIZATION WITH CORONARY ANGIOGRAM: SHX5451

## 2011-02-28 LAB — CBC
Hemoglobin: 13.8 g/dL (ref 13.0–17.0)
MCHC: 31.2 g/dL (ref 30.0–36.0)
RBC: 5.07 MIL/uL (ref 4.22–5.81)
WBC: 7.3 10*3/uL (ref 4.0–10.5)

## 2011-02-28 LAB — GLUCOSE, CAPILLARY
Glucose-Capillary: 119 mg/dL — ABNORMAL HIGH (ref 70–99)
Glucose-Capillary: 129 mg/dL — ABNORMAL HIGH (ref 70–99)
Glucose-Capillary: 153 mg/dL — ABNORMAL HIGH (ref 70–99)

## 2011-02-28 LAB — HEPARIN LEVEL (UNFRACTIONATED): Heparin Unfractionated: 0.41 IU/mL (ref 0.30–0.70)

## 2011-02-28 SURGERY — LEFT HEART CATHETERIZATION WITH CORONARY ANGIOGRAM
Anesthesia: Moderate Sedation | Laterality: Bilateral

## 2011-02-28 MED ORDER — EPINEPHRINE HCL 1 MG/ML IJ SOLN
0.5000 ug/min | INTRAVENOUS | Status: DC
  Filled 2011-02-28: qty 4

## 2011-02-28 MED ORDER — NITROGLYCERIN 0.2 MG/ML ON CALL CATH LAB
INTRAVENOUS | Status: AC
  Filled 2011-02-28: qty 1

## 2011-02-28 MED ORDER — DEXTROSE 5 % IV SOLN
750.0000 mg | INTRAVENOUS | Status: DC
  Filled 2011-02-28: qty 750

## 2011-02-28 MED ORDER — SODIUM CHLORIDE 0.9 % IV SOLN: 1.0000 mL/kg/h | INTRAVENOUS | Status: AC

## 2011-02-28 MED ORDER — MIDAZOLAM HCL 2 MG/2ML IJ SOLN
INTRAMUSCULAR | Status: AC
  Filled 2011-02-28: qty 2

## 2011-02-28 MED ORDER — FENTANYL CITRATE 0.05 MG/ML IJ SOLN
INTRAMUSCULAR | Status: AC
  Filled 2011-02-28: qty 2

## 2011-02-28 MED ORDER — METOPROLOL TARTRATE 1 MG/ML IV SOLN
INTRAVENOUS | Status: AC
  Administered 2011-02-28: 5 mg via INTRAVENOUS
  Filled 2011-02-28: qty 5

## 2011-02-28 MED ORDER — POTASSIUM CHLORIDE 2 MEQ/ML IV SOLN
80.0000 meq | INTRAVENOUS | Status: DC
  Filled 2011-02-28: qty 40

## 2011-02-28 MED ORDER — MORPHINE SULFATE 2 MG/ML IJ SOLN
2.0000 mg | INTRAMUSCULAR | Status: DC | PRN
  Administered 2011-02-28 – 2011-03-01 (×3): 2 mg via INTRAVENOUS
  Filled 2011-02-28 (×2): qty 1

## 2011-02-28 MED ORDER — HEPARIN (PORCINE) IN NACL 100-0.45 UNIT/ML-% IJ SOLN
2100.0000 [IU]/h | INTRAMUSCULAR | Status: DC
  Administered 2011-02-28 – 2011-03-01 (×2): 1750 [IU]/h via INTRAVENOUS
  Filled 2011-02-28 (×2): qty 250

## 2011-02-28 MED ORDER — DEXTROSE 5 % IV SOLN
1.5000 g | INTRAVENOUS | Status: AC
  Administered 2011-03-01: 1.5 g via INTRAVENOUS
  Administered 2011-03-01: .75 g via INTRAVENOUS
  Filled 2011-02-28: qty 1.5

## 2011-02-28 MED ORDER — SODIUM CHLORIDE 0.9 % IV SOLN
INTRAVENOUS | Status: AC
  Administered 2011-03-01: 1.6 [IU]/h via INTRAVENOUS
  Filled 2011-02-28: qty 1

## 2011-02-28 MED ORDER — NITROGLYCERIN IN D5W 200-5 MCG/ML-% IV SOLN
2.0000 ug/min | INTRAVENOUS | Status: DC
  Administered 2011-02-28 – 2011-03-01 (×2): 70 ug/min via INTRAVENOUS
  Administered 2011-03-01: 17:00:00 via INTRAVENOUS
  Filled 2011-02-28: qty 250

## 2011-02-28 MED ORDER — PHENYLEPHRINE HCL 10 MG/ML IJ SOLN
30.0000 ug/min | INTRAVENOUS | Status: AC
  Administered 2011-03-01: 10 ug/min via INTRAVENOUS
  Filled 2011-02-28: qty 2

## 2011-02-28 MED ORDER — LIDOCAINE HCL (PF) 1 % IJ SOLN
INTRAMUSCULAR | Status: AC
  Filled 2011-02-28: qty 30

## 2011-02-28 MED ORDER — VANCOMYCIN HCL 1000 MG IV SOLR
1500.0000 mg | INTRAVENOUS | Status: AC
  Administered 2011-03-01: 1500 mg via INTRAVENOUS
  Filled 2011-02-28: qty 1500

## 2011-02-28 MED ORDER — HEPARIN (PORCINE) IN NACL 2-0.9 UNIT/ML-% IJ SOLN
INTRAMUSCULAR | Status: AC
  Filled 2011-02-28: qty 2000

## 2011-02-28 MED ORDER — VERAPAMIL HCL 2.5 MG/ML IV SOLN
INTRAVENOUS | Status: AC
  Administered 2011-03-01: 18:00:00
  Filled 2011-02-28: qty 2.5

## 2011-02-28 MED ORDER — MORPHINE SULFATE 2 MG/ML IJ SOLN
INTRAMUSCULAR | Status: AC
  Administered 2011-02-28: 2 mg via INTRAVENOUS
  Filled 2011-02-28: qty 1

## 2011-02-28 MED ORDER — SODIUM CHLORIDE 0.9 % IV SOLN
INTRAVENOUS | Status: DC
  Administered 2011-02-28: 500 mL via INTRAVENOUS

## 2011-02-28 MED ORDER — DOPAMINE-DEXTROSE 3.2-5 MG/ML-% IV SOLN
2.0000 ug/kg/min | INTRAVENOUS | Status: DC
  Filled 2011-02-28: qty 250

## 2011-02-28 MED ORDER — METOPROLOL TARTRATE 1 MG/ML IV SOLN
5.0000 mg | INTRAVENOUS | Status: AC | PRN
Start: 2011-02-28 — End: 2011-02-28
  Administered 2011-02-28 (×2): 5 mg via INTRAVENOUS
  Filled 2011-02-28: qty 5

## 2011-02-28 MED ORDER — MAGNESIUM SULFATE 50 % IJ SOLN
40.0000 meq | INTRAMUSCULAR | Status: DC
  Filled 2011-02-28: qty 10

## 2011-02-28 MED ORDER — SODIUM CHLORIDE 0.9 % IV SOLN
0.1000 ug/kg/h | INTRAVENOUS | Status: AC
  Administered 2011-03-01: .3 ug/kg/h via INTRAVENOUS
  Filled 2011-02-28: qty 4

## 2011-02-28 MED ORDER — SODIUM CHLORIDE 0.9 % IV SOLN
INTRAVENOUS | Status: AC
  Administered 2011-03-01: 69.8 mL/h via INTRAVENOUS
  Filled 2011-02-28: qty 40

## 2011-02-28 MED ORDER — ACETAMINOPHEN 325 MG PO TABS: 650.0000 mg | ORAL_TABLET | ORAL | Status: DC | PRN

## 2011-02-28 MED ORDER — NITROGLYCERIN IN D5W 200-5 MCG/ML-% IV SOLN
2.0000 ug/min | INTRAVENOUS | Status: DC
  Filled 2011-02-28 (×2): qty 250

## 2011-02-28 MED ORDER — ONDANSETRON HCL 4 MG/2ML IJ SOLN: 4.0000 mg | Freq: Four times a day (QID) | INTRAMUSCULAR | Status: DC | PRN

## 2011-02-28 NOTE — Consult Note (Signed)
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 male.  HPI:   47 man with PMH of HTN, HLD, type 2 DM who was admitted with chest pain. He had been moving and he kept having to stop picking up/moving boxes because of chest pain that was relieved by sitting down and resting for several minutes. He tells me he has had chest pain for several years that comes on with exertion; however, he can ride a bicycle for 2-3 miles (leisurely) without the onset of chest pain. He has been a diabetic for several years and is currently taking 500 mg metformin bid. He reports cardiac cath at [facility] in 2005 or 2006 and says he had no significant diease at that time. He received 4 baby aspirin and was started on heparin gtt in the ER. POC troponin 0.13 but initial lab troponin negative with CKMB 5.3. Cardiac catheterization today showed severe multivessel disease with good left ventricular function. Since catheterization he has had recurrent 10/10 chest pain brought on by minor activity such as sitting on the side of the bed to urinate and adjusting the covers on his bed. He has been started on heparin and nitroglycerin and has been receiving morphine sulfate and beta blockers. He is currently pain-free. Electrocardiogram done during his severe chest pain episode showed no significant change from his baseline with no acute ischemic changes.       Past Medical History  Diagnosis Date  . Diabetes mellitus   . Hypertension   . Stroke   . Angina     History reviewed. No pertinent past surgical history.  History reviewed. No pertinent family history.  Social History:  reports that he has quit smoking. He has never used smokeless tobacco. He reports that he does not drink alcohol or use illicit  drugs.  Allergies: No Known Allergies  Medications:  I have reviewed the patient's current medications. Prior to Admission:  Prescriptions prior to admission  Medication Sig Dispense Refill  . metFORMIN (GLUCOPHAGE) 1000 MG tablet Take 1,000 mg by mouth daily.       Scheduled:   . active partnership for health of your heart book   Does not apply Once  . aspirin  324 mg Oral Pre-Cath  . aspirin EC  81 mg Oral Daily  . captopril  6.25 mg Oral TID  . diazepam  5 mg Oral On Call  . fentaNYL      . heparin      . heparin  2,000 Units Intravenous Once  . lidocaine      . metoprolol tartrate  12.5 mg Oral BID  . midazolam      . nitroGLYCERIN      . simvastatin  20 mg Oral q1800  . DISCONTD: sodium chloride  3 mL Intravenous Q12H   Continuous:   . sodium chloride    . heparin 1,750 Units/hr (02/28/11 1355)  . nitroGLYCERIN 70 mcg/min (02/28/11 1928)  . DISCONTD: sodium chloride 75 mL/hr (02/28/11 0352)  . DISCONTD: heparin 1,350 Units/hr (02/27/11 1900)  . DISCONTD: heparin 1,650 Units/hr (02/27/11 2155)  . DISCONTD: nitroGLYCERIN  Stopped (02/28/11 0931)   ZOX:[id number], metoprolol, morphine injection, nitroGLYCERIN, ondansetron (ZOFRAN) IV, DISCONTD: sodium chloride, DISCONTD: acetaminophen, DISCONTD: ondansetron (ZOFRAN) IV, DISCONTD: sodium chloride  Results for orders placed during the hospital encounter of 02/26/11 (from the past 48 hour(s))  URINALYSIS, ROUTINE W REFLEX MICROSCOPIC     Status: Normal   Collection Time   02/26/11  9:56 PM      Component Value Range Comment   Color, Urine YELLOW  YELLOW     APPearance CLEAR  CLEAR     Specific Gravity, Urine 1.012  1.005 - 1.030     pH 7.0  5.0 - 8.0     Glucose, UA NEGATIVE  NEGATIVE (mg/dL)    Hgb urine dipstick NEGATIVE  NEGATIVE     Bilirubin Urine NEGATIVE  NEGATIVE     Ketones, ur NEGATIVE  NEGATIVE (mg/dL)    Protein, ur NEGATIVE  NEGATIVE (mg/dL)    Urobilinogen, UA 1.0  0.0 - 1.0 (mg/dL)    Nitrite  NEGATIVE  NEGATIVE     Leukocytes, UA NEGATIVE  NEGATIVE  MICROSCOPIC NOT DONE ON URINES WITH NEGATIVE PROTEIN, BLOOD, LEUKOCYTES, NITRITE, OR GLUCOSE <1000 mg/dL.  MRSA PCR SCREENING     Status: Normal   Collection Time   02/26/11 11:31 PM      Component Value Range Comment   MRSA by PCR NEGATIVE  NEGATIVE    CARDIAC PANEL(CRET KIN+CKTOT+MB+TROPI)     Status: Abnormal   Collection Time   02/26/11 11:38 PM      Component Value Range Comment   Total CK 188  7 - 232 (U/L)    CK, MB 6.0 (*) 0.3 - 4.0 (ng/mL)    Troponin I 0.38 (*) <0.30 (ng/mL)    Relative Index 3.2 (*) 0.0 - 2.5    PROTIME-INR     Status: Normal   Collection Time   02/26/11 11:38 PM      Component Value Range Comment   Prothrombin Time 13.9  11.6 - 15.2 (seconds)    INR 1.05  0.00 - 1.49    APTT     Status: Abnormal   Collection Time   02/26/11 11:38 PM      Component Value Range Comment   aPTT 67 (*) 24 - 37 (seconds)   LIPID PANEL     Status: Abnormal   Collection Time   02/27/11  4:25 AM      Component Value Range Comment   Cholesterol 197  0 - 200 (mg/dL)    Triglycerides 94  <147 (mg/dL)    HDL 41  >82 (mg/dL)    Total CHOL/HDL Ratio 4.8      VLDL 19  0 - 40 (mg/dL)    LDL Cholesterol 956 (*) 0 - 99 (mg/dL)   GLUCOSE, CAPILLARY     Status: Abnormal   Collection Time   02/27/11  8:15 AM      Component Value Range Comment   Glucose-Capillary 125 (*) 70 - 99 (mg/dL)    Comment 1 Documented in Chart      Comment 2 Notify RN     CARDIAC PANEL(CRET KIN+CKTOT+MB+TROPI)     Status: Abnormal   Collection Time   02/27/11  9:17 AM      Component Value Range Comment   Total CK 217  7 - 232 (U/L)    CK, MB 6.3 (*) 0.3 - 4.0 (ng/mL)    Troponin I 0.41 (*) <0.30 (ng/mL)    Relative Index 2.9 (*) 0.0 -  2.5    BASIC METABOLIC PANEL     Status: Abnormal   Collection Time   02/27/11  9:17 AM      Component Value Range Comment   Sodium 141  135 - 145 (mEq/L)    Potassium 4.1  3.5 - 5.1 (mEq/L)    Chloride 101  96 - 112  (mEq/L)    CO2 29  19 - 32 (mEq/L)    Glucose, Bld 182 (*) 70 - 99 (mg/dL)    BUN 8  6 - 23 (mg/dL)    Creatinine, Ser 1.19  0.50 - 1.35 (mg/dL)    Calcium 9.2  8.4 - 10.5 (mg/dL)    GFR calc non Af Amer >90  >90 (mL/min)    GFR calc Af Amer >90  >90 (mL/min)   HEPARIN LEVEL (UNFRACTIONATED)     Status: Abnormal   Collection Time   02/27/11  9:17 AM      Component Value Range Comment   Heparin Unfractionated <0.10 (*) 0.30 - 0.70 (IU/mL)   LIPID PANEL     Status: Abnormal   Collection Time   02/27/11  9:17 AM      Component Value Range Comment   Cholesterol 200  0 - 200 (mg/dL)    Triglycerides 87  <147 (mg/dL)    HDL 45  >82 (mg/dL)    Total CHOL/HDL Ratio 4.4      VLDL 17  0 - 40 (mg/dL)    LDL Cholesterol 956 (*) 0 - 99 (mg/dL)   HEMOGLOBIN O1H     Status: Abnormal   Collection Time   02/27/11  9:17 AM      Component Value Range Comment   Hemoglobin A1C 5.9 (*) <5.7 (%)    Mean Plasma Glucose 123 (*) <117 (mg/dL)   PROTIME-INR     Status: Normal   Collection Time   02/27/11  9:17 AM      Component Value Range Comment   Prothrombin Time 14.1  11.6 - 15.2 (seconds)    INR 1.07  0.00 - 1.49    CBC     Status: Normal   Collection Time   02/27/11  9:17 AM      Component Value Range Comment   WBC 5.5  4.0 - 10.5 (K/uL)    RBC 5.10  4.22 - 5.81 (MIL/uL)    Hemoglobin 14.1  13.0 - 17.0 (g/dL)    HCT 08.6  57.8 - 46.9 (%)    MCV 86.5  78.0 - 100.0 (fL)    MCH 27.6  26.0 - 34.0 (pg)    MCHC 32.0  30.0 - 36.0 (g/dL)    RDW 62.9  52.8 - 41.3 (%)    Platelets 175  150 - 400 (K/uL)   PLATELET INHIBITION P2Y12     Status: Normal   Collection Time   02/27/11  9:17 AM      Component Value Range Comment   Platelet Function  P2Y12 321  194 - 418 (PRU)   GLUCOSE, CAPILLARY     Status: Abnormal   Collection Time   02/27/11 12:31 PM      Component Value Range Comment   Glucose-Capillary 130 (*) 70 - 99 (mg/dL)    Comment 1 Documented in Chart      Comment 2 Notify RN     HEPARIN LEVEL  (UNFRACTIONATED)     Status: Abnormal   Collection Time   02/27/11  8:30 PM      Component Value  Range Comment   Heparin Unfractionated 0.16 (*) 0.30 - 0.70 (IU/mL)   GLUCOSE, CAPILLARY     Status: Abnormal   Collection Time   02/27/11 10:00 PM      Component Value Range Comment   Glucose-Capillary 172 (*) 70 - 99 (mg/dL)   CBC     Status: Normal   Collection Time   02/28/11  4:30 AM      Component Value Range Comment   WBC 7.3  4.0 - 10.5 (K/uL)    RBC 5.07  4.22 - 5.81 (MIL/uL)    Hemoglobin 13.8  13.0 - 17.0 (g/dL)    HCT 16.1  09.6 - 04.5 (%)    MCV 87.4  78.0 - 100.0 (fL)    MCH 27.2  26.0 - 34.0 (pg)    MCHC 31.2  30.0 - 36.0 (g/dL)    RDW 40.9  81.1 - 91.4 (%)    Platelets 201  150 - 400 (K/uL)   HEPARIN LEVEL (UNFRACTIONATED)     Status: Normal   Collection Time   02/28/11  4:30 AM      Component Value Range Comment   Heparin Unfractionated 0.41  0.30 - 0.70 (IU/mL)   GLUCOSE, CAPILLARY     Status: Abnormal   Collection Time   02/28/11  8:26 AM      Component Value Range Comment   Glucose-Capillary 119 (*) 70 - 99 (mg/dL)   POCT ACTIVATED CLOTTING TIME     Status: Normal   Collection Time   02/28/11  9:35 AM      Component Value Range Comment   Activated Clotting Time 133     GLUCOSE, CAPILLARY     Status: Normal   Collection Time   02/28/11  9:48 AM      Component Value Range Comment   Glucose-Capillary 95  70 - 99 (mg/dL)   GLUCOSE, CAPILLARY     Status: Abnormal   Collection Time   02/28/11  5:44 PM      Component Value Range Comment   Glucose-Capillary 129 (*) 70 - 99 (mg/dL)     No results found.  Review of Systems  Constitutional: Positive for fever and malaise/fatigue. Negative for chills and weight loss.  HENT: Negative.   Eyes: Negative.   Respiratory: Positive for shortness of breath.   Cardiovascular: Positive for chest pain. Negative for palpitations, orthopnea, claudication, leg swelling and PND.  Gastrointestinal: Negative.   Genitourinary: Negative.     Musculoskeletal: Negative.   Skin: Negative.   Neurological: Negative.   Endo/Heme/Allergies: Negative.   Psychiatric/Behavioral: Negative.    Blood pressure 132/80, pulse 90, temperature 98.6 F (37 C), temperature source Oral, resp. rate 17, height 6' (1.829 m), weight 84 kg (185 lb 3 oz), SpO2 93.00%. Physical Exam  Constitutional: He is oriented to person, place, and time. He appears well-developed and well-nourished. No distress.  HENT:  Head: Normocephalic and atraumatic.  Eyes: EOM are normal. Pupils are equal, round, and reactive to light.  Neck: Normal range of motion. No JVD present. No thyromegaly present.  Cardiovascular: Normal rate, regular rhythm and normal heart sounds.  Exam reveals no gallop and no friction rub.   No murmur heard. Respiratory: Effort normal and breath sounds normal. No respiratory distress.  GI: Soft. Bowel sounds are normal. He exhibits no mass. There is no tenderness.  Musculoskeletal: Normal range of motion. He exhibits no edema.  Lymphadenopathy:    He has no cervical adenopathy.  Neurological: He is alert  and oriented to person, place, and time. He has normal strength. No cranial nerve deficit or sensory deficit.  Skin: Skin is warm and dry.  Psychiatric: He has a normal mood and affect.     Cardiac Cath:   HEMODYNAMICS:  Aortic pressure was 145/4mmHg ; LV pressure was 147/56mmHg; LVEDP .  There was no gradient between the left ventricle and aorta.    ANGIOGRAPHIC DATA:   The left main coronary artery is calcified but widely patent.  It bifurcates into a left anterior descending artery and left circumflex artery.    The left anterior descending artery is patent in its proximal portion and then give off a large first diagonal branch.  The diagonal branches into a superior and inferior branch. The superior branch has an 70% stenosis.   The mid LAD has an 80% stenosis.  The ongoing LAD is widely patent.    The left circumflex artery has  a 70% proximal stenosis and then a 90% mid stenosis followed by an aneurysmal segment.  It then gives rise to a first OM which is moderate in size and widely patent.  The ongoing left circumflex is widely patent.   The right coronary artery is widely patent in throughout its course.  It gives rise to 2 small acute RV marginal branches and then bifurcates into a posterior descending artery and posterolateral branch.  The PDA has a 90% stenosis in the mid portion.  LEFT VENTRICULOGRAM:  Left ventricular angiogram was done in the 30 RAO projection and revealed normal left ventricular wall motion and systolic function with an estimated ejection fraction of 50-55%.  LVEDP was 8 mmHg.  IMPRESSIONS:    1. Calcified but patent left main coronary artery.  2. 80% mid LAD, 70% superior branch stenosis of Diagonal  3. 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  4. 90% mid PDA stenosis.  5. Low normal left ventricular systolic function.  LVEDP 8 mmHg.  Ejection fraction 50-55%. 6.   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 I agree that coronary bypass graft surgery is the best treatment to prevent further ischemia and infarction and improve his quality of life. I would continue him on heparin and nitroglycerin at this time and consider using Integrilin if he develops recurrent chest discomfort. I will plan to do him tomorrow afternoon. If he develops recurrent symptoms that cannot be controlled medically we will need to proceed with surgery sooner. I discussed the operative procedure with the patient and family including alternatives, benefits and risks; including but not limited to bleeding, blood transfusion, infection, stroke, myocardial infarction, graft failure, heart block requiring a permanent pacemaker, organ dysfunction, and death.  Joel Santiago understands and agrees to proceed.   We will schedule surgery for tomorrow.  BARTLE,BRYAN K 02/28/2011, 9:05 PM

## 2011-02-28 NOTE — Progress Notes (Signed)
SUBJECTIVE:  In cath lab for cath  OBJECTIVE:   Vitals:   Filed Vitals:   02/28/11 0122 02/28/11 0400 02/28/11 0500 02/28/11 0800  BP:  132/79    Pulse: 69 76  81  Temp: 98.8 F (37.1 C) 99.2 F (37.3 C)  97.5 F (36.4 C)  TempSrc: Oral Oral  Oral  Resp: 15 14    Height:      Weight:   84 kg (185 lb 3 oz)   SpO2: 97% 97%     I&O's:   Intake/Output Summary (Last 24 hours) at 02/28/11 4098 Last data filed at 02/28/11 0700  Gross per 24 hour  Intake 1255.98 ml  Output   1076 ml  Net 179.98 ml   TELEMETRY: Reviewed telemetry pt in NSR:     PHYSICAL EXAM General: Well developed, well nourished, in no acute distress Head: Eyes PERRLA, No xanthomas.   Normal cephalic and atramatic  Lungs:   Clear bilaterally to auscultation and percussion. Heart:   HRRR S1 S2 Pulses are 2+ & equal.            No carotid bruit. No JVD.  No abdominal bruits. No femoral bruits. Abdomen: Bowel sounds are positive, abdomen soft and non-tender without masses  Extremities:   No clubbing, cyanosis or edema.  DP +1 Neuro: Alert and oriented X 3. Psych:  Good affect, responds appropriately   LABS: Basic Metabolic Panel:  Basename 02/27/11 0917 02/26/11 2010  NA 141 137  K 4.1 4.5  CL 101 101  CO2 29 29  GLUCOSE 182* 122*  BUN 8 10  CREATININE 0.65 0.69  CALCIUM 9.2 9.2  MG -- --  PHOS -- --   Liver Function Tests:  [facility] 02/26/11 2010  AST 21  ALT 39  ALKPHOS 69  BILITOT 0.3  PROT 7.5  ALBUMIN 3.6   No results found for this basename: LIPASE:2,AMYLASE:2 in the last 72 hours CBC:  Basename 02/28/11 0430 02/27/11 0917 02/26/11 2010  WBC 7.3 5.5 --  NEUTROABS -- -- 3.4  HGB 13.8 14.1 --  HCT 44.3 44.1 --  MCV 87.4 86.5 --  PLT 201 175 --   Cardiac Enzymes:  Basename 02/27/11 0917 02/26/11 2338 02/26/11 2011  CKTOTAL 217 188 186  CKMB 6.3* 6.0* 5.3*  CKMBINDEX -- -- --  TROPONINI 0.41* 0.38* <0.30   Hemoglobin A1C:  Basename 02/27/11 0917  HGBA1C 5.9*    Fasting Lipid Panel:  Basename 02/27/11 0917  CHOL 200  HDL 45  LDLCALC 138*  TRIG 87  CHOLHDL 4.4  LDLDIRECT --   Coag Panel:   Lab Results  Component Value Date   INR 1.07 02/27/2011   INR 1.05 02/26/2011    RADIOLOGY: Dg Abd Acute W/chest  02/26/2011  *RADIOLOGY REPORT*  Clinical Data: Generalized chest and abdominal pain.  ACUTE ABDOMEN SERIES (ABDOMEN 2 VIEW & CHEST 1 VIEW)  Comparison: None.  Findings: The heart size is at the upper limits of normal.  The lungs are clear.  Supine and upright views the abdomen demonstrates moderate stool throughout the colon.  No obstruction or free air is present.  Degenerative changes are present in the lower lumbar spine.  IMPRESSION:  1.  Borderline cardiomegaly without failure. 2.  Moderate stool throughout the colon. 3.  No acute abnormality of the chest or abdomen.  Original Report Authenticated By: Jamesetta Orleans. MATTERN, M.D.      ASSESSMENT:  1. USAP with small enzyme leak c/w NSTEMI  2. DM  3. HTN  4. CVA   PLAN:   Cardiac cath today for eval of coronary anatomyCardiac catheterization was discussed with the patient fully including risks on myocardial infarction, death, stroke, bleeding, arrhythmia, dye allergy, renal insufficiency or bleeding.  All patient questions and concerns were discussed and the patient understands and is willing to proceed.      Quintella Reichert, MD  02/28/2011  9:09 AM

## 2011-02-28 NOTE — H&P (View-Only) (Signed)
SUBJECTIVE:  Had more chest pain this am but mild  OBJECTIVE:   Vitals:   Filed Vitals:   02/27/11 0000 02/27/11 0400 02/27/11 0500 02/27/11 0816  BP: 162/95 135/84  148/77  Pulse: 70 70  73  Temp:  97.7 F (36.5 C)  97.5 F (36.4 C)  TempSrc:  Oral  Oral  Resp: 20 15  15   Height:      Weight:   84.8 kg (186 lb 15.2 oz)   SpO2: 98% 97%  99%   I&O's:   Intake/Output Summary (Last 24 hours) at 02/27/11 0843 Last data filed at 02/27/11 4098  Gross per 24 hour  Intake    200 ml  Output    400 ml  Net   -200 ml   TELEMETRY: Reviewed telemetry pt in NSR     PHYSICAL EXAM General: Well developed, well nourished, in no acute distress Head: Eyes PERRLA, No xanthomas.   Normal cephalic and atramatic  Lungs:   Clear bilaterally to auscultation and percussion. Heart:   HRRR S1 S2 Pulses are 2+ & equal.            No carotid bruit. No JVD.  No abdominal bruits. No femoral bruits. Abdomen: Bowel sounds are positive, abdomen soft and non-tender without masses or                  Hernia's noted. Msk:  Back normal, normal gait. Normal strength and tone for age. Extremities:   No clubbing, cyanosis or edema.  DP +1 Neuro: Alert and oriented X 3. Psych:  Good affect, responds appropriately   LABS: Basic Metabolic Panel:  Basename 02/26/11 2010  NA 137  K 4.5  CL 101  CO2 29  GLUCOSE 122*  BUN 10  CREATININE 0.69  CALCIUM 9.2  MG --  PHOS --   Liver Function Tests:  Basename 02/26/11 2010  AST 21  ALT 39  ALKPHOS 69  BILITOT 0.3  PROT 7.5  ALBUMIN 3.6    CBC:  Basename 02/26/11 2010  WBC 6.0  NEUTROABS 3.4  HGB 14.0  HCT 43.0  MCV 86.0  PLT 190   Cardiac Enzymes:  Basename 02/26/11 2338 02/26/11 2011  CKTOTAL 188 186  CKMB 6.0* 5.3*  CKMBINDEX -- --  TROPONINI 0.38* <0.30   Fasting Lipid Panel:  Basename 02/27/11 0425  CHOL 197  HDL 41  LDLCALC 137*  TRIG 94  CHOLHDL 4.8  LDLDIRECT --   Coag Panel:   Lab Results  Component Value Date   INR 1.05 02/26/2011    RADIOLOGY: Dg Abd Acute W/chest  02/26/2011  *RADIOLOGY REPORT*  Clinical Data: Generalized chest and abdominal pain.  ACUTE ABDOMEN SERIES (ABDOMEN 2 VIEW & CHEST 1 VIEW)  Comparison: None.  Findings: The heart size is at the upper limits of normal.  The lungs are clear.  Supine and upright views the abdomen demonstrates moderate stool throughout the colon.  No obstruction or free air is present.  Degenerative changes are present in the lower lumbar spine.  IMPRESSION:  1.  Borderline cardiomegaly without failure. 2.  Moderate stool throughout the colon. 3.  No acute abnormality of the chest or abdomen.  Original Report Authenticated By: Jamesetta Orleans. MATTERN, M.D.      ASSESSMENT:  1.  USAP with small enzyme leak c/w NSTEMI 2.  DM 3.  HTN 4.  CVA   PLAN:   1.  NPO after midnight 2.  Cardiac cath in am 3.  Start IV NTG gtt for chest pain 4.  Hold Metformin 5.  Continue IV Heparin gtt 6.  Continue ASA/ACE I/beta blocker/statin Cardiac catheterization was discussed with the patient fully including risks on myocardial infarction, death, stroke, bleeding, arrhythmia, dye allergy, renal insufficiency or bleeding.  All patient questions and concerns were discussed and the patient understands and is willing to proceed.     Quintella Reichert, MD  02/27/2011  8:43 AM

## 2011-02-28 NOTE — Progress Notes (Signed)
UR Completed. Simmons, Jamison Soward F [phone]  

## 2011-02-28 NOTE — Progress Notes (Signed)
ANTICOAGULATION CONSULT NOTE - Follow Up Consult  Pharmacy Consult for heparin Indication: chest pain/ACS  Labs:  Basename 02/28/11 0430 02/27/11 2030 02/27/11 0917 02/26/11 2338 02/26/11 2011 02/26/11 2010  HGB 13.8 -- 14.1 -- -- --  HCT 44.3 -- 44.1 -- -- 43.0  PLT 201 -- 175 -- -- 190  APTT -- -- -- 67* -- --  LABPROT -- -- 14.1 13.9 -- --  INR -- -- 1.07 1.05 -- --  HEPARINUNFRC 0.41 0.16* <0.10* -- -- --  CREATININE -- -- 0.65 -- -- 0.69  CKTOTAL -- -- 217 188 186 --  CKMB -- -- 6.3* 6.0* 5.3* --  TROPONINI -- -- 0.41* 0.38* <0.30 --   Assessment: 71yo male now therapeutic on heparin for CP after multiple rate increases.  Possible cath today though not yet on schedule.  Goal of Therapy:  Heparin level 0.3-0.7 units/ml   Plan:  Will continue heparin gtt at current rate and confirm stable with additional level.  Colleen Can PharmD BCPS 02/28/2011,6:05 AM

## 2011-02-28 NOTE — Interval H&P Note (Signed)
History and Physical Interval Note:  02/28/2011 9:08 AM  Golden Pop  has presented today for surgery, with the diagnosis of NSTEMI  The various methods of treatment have been discussed with the patient and family. After consideration of risks, benefits and other options for treatment, the patient has consented to  Procedure(s): LEFT HEART CATHETERIZATION WITH CORONARY ANGIOGRAM as a surgical intervention .  The patients' history has been reviewed, patient examined, no change in status, stable for surgery.  I have reviewed the patients' chart and labs.  Questions were answered to the patient's satisfaction.     TURNER,TRACI R

## 2011-02-28 NOTE — Progress Notes (Signed)
Called at 1335 to assist with patient with unrelieved chest pain.  71 year old male admitted Saturday night with NSTEMI had cardiac cath this AM now presenting with post-procedure chest pain.   On arrival patient supine in bed - c/o 10/10 chest pain at substernal area - radiates to left shoulder - no nausea - no SOB - no diaphoresis.  Speaking clearly - states it is same pain he called 911 for- SR on monitor - no changes in 12 Lead - bil BS equal and clear - right femoral cath site DDI - skin w/d - increased NTG gtt to 30 mcg - RN Peggy calling Dr. Mayford Knife. BP 178/108 HR 85 RR 22 - O2 sat 96% on 2 liter  - O2 increased to 4 liters - NTG to 40 mcg - pain still at 10/10 - MSO4 2mg  IV given - within 2 minutes pain to 4/10.  NTG at 50 mcg.  At 1410 pain completely resolved.  BP 138/78 HR 89 RR 16 O2 sat 98%.  Bil BS remain clear.  Heparin restarted at 1750 units per order.  Patient resting - transferred to 2915 per RN and staff 2000.  Currently pain free on NTG.

## 2011-02-28 NOTE — Op Note (Signed)
PROCEDURE:  Left heart catheterization with selective coronary angiography, left ventriculogram.  INDICATIONS:  Chest pain with NSTEMI  The risks, benefits, and details of the procedure were explained to the patient.  The patient verbalized understanding and wanted to proceed.  Informed written consent was obtained.  PROCEDURE TECHNIQUE:  After Xylocaine anesthesia a 62F sheath was placed in the right femoral artery with a single anterior needle wall stick.   Left coronary angiography was done using a Judkins L4 guide catheter.  Right coronary angiography was done using a Judkins R4 guide catheter.  Left ventriculography was done using a pigtail catheter.    CONTRAST:  Total of 75 cc.  COMPLICATIONS:  None.    HEMODYNAMICS:  Aortic pressure was 145/40mmHg ; LV pressure was 147/16mmHg; LVEDP .  There was no gradient between the left ventricle and aorta.    ANGIOGRAPHIC DATA:   The left main coronary artery is calcified but widely patent.  It bifurcates into a left anterior descending artery and left circumflex artery.    The left anterior descending artery is patent in its proximal portion and then give off a large first diagonal branch.  The diagonal branches into a superior and inferior branch. The superior branch has an 70% stenosis.   The mid LAD has an 80% stenosis.  The ongoing LAD is widely patent.    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  It then gives rise to a first OM which is moderate in size and widely patent.  The ongoing left circumflex is widely patent.   The right coronary artery is widely patent in throughout its course.  It gives rise to 2 small acute RV marginal branches and then bifurcates into a posterior descending artery and posterolateral branch.  The PDA has a 90% stenosis in the mid portion.  LEFT VENTRICULOGRAM:  Left ventricular angiogram was done in the 30 RAO projection and  revealed normal left ventricular wall motion and systolic function with an estimated ejection fraction of 50-55%.  LVEDP was 8 mmHg.  IMPRESSIONS:  1. Calcified but patent left main coronary artery. 2. 80% mid LAD, 70% superior branch stenosis of Diagonal 3. 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 4. 90% mid PDA stenosis. 5. Low normal left ventricular systolic function.  LVEDP 8 mmHg.  Ejection fraction 50-55%.  RECOMMENDATION:   1.  Restart IV Heparin 4 hours after cath with no bolus. 2.  Continue IV NTG gtt 3.  Continue ASA 4.  CVTS consult for CABG 5.  Continue ACE I/beta blocker

## 2011-02-28 NOTE — Progress Notes (Signed)
Pharmacy - Heparin  To resume heparin 8 hours after sheath removal S/p cath with CVTS consult planned  Plan: 1) Resume heparin at 1815 pm at 1750 units / hr 2) Heparin level 8 hours after heparin resumed  Thank you.  Okey Regal, PharmD

## 2011-03-01 ENCOUNTER — Encounter (HOSPITAL_COMMUNITY): Payer: Self-pay | Admitting: Anesthesiology

## 2011-03-01 ENCOUNTER — Encounter (HOSPITAL_COMMUNITY)
Admission: EM | Disposition: A | Discharge status: Home Health | Payer: Self-pay | Source: Home / Self Care | Attending: Surgery

## 2011-03-01 ENCOUNTER — Inpatient Hospital Stay (HOSPITAL_COMMUNITY): Payer: Medicare Other

## 2011-03-01 ENCOUNTER — Inpatient Hospital Stay (HOSPITAL_COMMUNITY): Payer: Medicare Other | Admitting: Anesthesiology

## 2011-03-01 ENCOUNTER — Other Ambulatory Visit: Payer: Self-pay

## 2011-03-01 DIAGNOSIS — Z01810 Encounter for preprocedural cardiovascular examination: Secondary | ICD-10-CM

## 2011-03-01 DIAGNOSIS — I2510 Atherosclerotic heart disease of native coronary artery without angina pectoris: Secondary | ICD-10-CM

## 2011-03-01 HISTORY — PX: CORONARY ARTERY BYPASS GRAFT: SHX141

## 2011-03-01 LAB — POCT I-STAT 4, (NA,K, GLUC, HGB,HCT)
Glucose, Bld: 112 mg/dL — ABNORMAL HIGH (ref 70–99)
Glucose, Bld: 95 mg/dL (ref 70–99)
Glucose, Bld: 116 mg/dL — ABNORMAL HIGH (ref 70–99)
Glucose, Bld: 180 mg/dL — ABNORMAL HIGH (ref 70–99)
Glucose, Bld: 116 mg/dL — ABNORMAL HIGH (ref 70–99)
HCT: 28 % — ABNORMAL LOW (ref 39.0–52.0)
HCT: 34 % — ABNORMAL LOW (ref 39.0–52.0)
HCT: 30 % — ABNORMAL LOW (ref 39.0–52.0)
HCT: 31 % — ABNORMAL LOW (ref 39.0–52.0)
HCT: 32 % — ABNORMAL LOW (ref 39.0–52.0)
Hemoglobin: 11.6 g/dL — ABNORMAL LOW (ref 13.0–17.0)
Hemoglobin: 9.5 g/dL — ABNORMAL LOW (ref 13.0–17.0)
Hemoglobin: 10.2 g/dL — ABNORMAL LOW (ref 13.0–17.0)
Hemoglobin: 10.5 g/dL — ABNORMAL LOW (ref 13.0–17.0)
Potassium: 3.5 mEq/L (ref 3.5–5.1)
Potassium: 4.2 meq/L (ref 3.5–5.1)
Potassium: 4.4 meq/L (ref 3.5–5.1)
Potassium: 3.9 meq/L (ref 3.5–5.1)
Potassium: 3.7 mEq/L (ref 3.5–5.1)
Sodium: 139 mEq/L (ref 135–145)
Sodium: 140 meq/L (ref 135–145)
Sodium: 142 mEq/L (ref 135–145)
Sodium: 138 meq/L (ref 135–145)
Sodium: 139 meq/L (ref 135–145)

## 2011-03-01 LAB — CBC
HCT: 39.1 % (ref 39.0–52.0)
HCT: 35.2 % — ABNORMAL LOW (ref 39.0–52.0)
Hemoglobin: 12.7 g/dL — ABNORMAL LOW (ref 13.0–17.0)
Hemoglobin: 11.4 g/dL — ABNORMAL LOW (ref 13.0–17.0)
MCH: 27.9 pg (ref 26.0–34.0)
MCV: 86.1 fL (ref 78.0–100.0)
Platelets: 187 10*3/uL (ref 150–400)
RBC: 4.52 MIL/uL (ref 4.22–5.81)
RBC: 4.09 MIL/uL — ABNORMAL LOW (ref 4.22–5.81)
RDW: 13.3 % (ref 11.5–15.5)
WBC: 7 10*3/uL (ref 4.0–10.5)
WBC: 9.5 10*3/uL (ref 4.0–10.5)
WBC: 10.9 10*3/uL — ABNORMAL HIGH (ref 4.0–10.5)

## 2011-03-01 LAB — TYPE AND SCREEN
ABO/RH(D): A POS
Antibody Screen: NEGATIVE

## 2011-03-01 LAB — GLUCOSE, CAPILLARY
Glucose-Capillary: 144 mg/dL — ABNORMAL HIGH (ref 70–99)
Glucose-Capillary: 103 mg/dL — ABNORMAL HIGH (ref 70–99)

## 2011-03-01 LAB — POCT I-STAT 3, ART BLOOD GAS (G3+)
Bicarbonate: 27.5 mEq/L — ABNORMAL HIGH (ref 20.0–24.0)
pCO2 arterial: 53.3 mmHg — ABNORMAL HIGH (ref 35.0–45.0)
pH, Arterial: 7.32 — ABNORMAL LOW (ref 7.350–7.450)
pO2, Arterial: 260 mmHg — ABNORMAL HIGH (ref 80.0–100.0)

## 2011-03-01 LAB — HEPARIN LEVEL (UNFRACTIONATED)
Heparin Unfractionated: 0.25 IU/mL — ABNORMAL LOW (ref 0.30–0.70)
Heparin Unfractionated: 0.2 IU/mL — ABNORMAL LOW (ref 0.30–0.70)

## 2011-03-01 LAB — HEMOGLOBIN AND HEMATOCRIT, BLOOD: Hemoglobin: 9.7 g/dL — ABNORMAL LOW (ref 13.0–17.0)

## 2011-03-01 LAB — APTT: aPTT: 36 seconds (ref 24–37)

## 2011-03-01 LAB — PLATELET COUNT: Platelets: 114 10*3/uL — ABNORMAL LOW (ref 150–400)

## 2011-03-01 SURGERY — CORONARY ARTERY BYPASS GRAFTING (CABG)
Anesthesia: General | Site: Chest | Wound class: Clean

## 2011-03-01 MED ORDER — METOPROLOL TARTRATE 12.5 MG HALF TABLET
12.5000 mg | ORAL_TABLET | Freq: Two times a day (BID) | ORAL | Status: DC
  Administered 2011-03-02 – 2011-03-03 (×3): 12.5 mg via ORAL
  Filled 2011-03-01 (×4): qty 1

## 2011-03-01 MED ORDER — HEPARIN SODIUM (PORCINE) 1000 UNIT/ML IJ SOLN
INTRAMUSCULAR | Status: DC | PRN
  Administered 2011-03-01: 34000 [IU] via INTRAVENOUS

## 2011-03-01 MED ORDER — MIDAZOLAM HCL 5 MG/5ML IJ SOLN
INTRAMUSCULAR | Status: DC | PRN
  Administered 2011-03-01 (×2): 2 mg via INTRAVENOUS
  Administered 2011-03-01 (×2): 3 mg via INTRAVENOUS

## 2011-03-01 MED ORDER — BISACODYL 5 MG PO TBEC: 5.0000 mg | DELAYED_RELEASE_TABLET | Freq: Once | ORAL | Status: DC

## 2011-03-01 MED ORDER — HEMOSTATIC AGENTS (NO CHARGE) OPTIME
TOPICAL | Status: DC | PRN
  Administered 2011-03-01: 1 via TOPICAL

## 2011-03-01 MED ORDER — FAMOTIDINE IN NACL 20-0.9 MG/50ML-% IV SOLN
20.0000 mg | Freq: Two times a day (BID) | INTRAVENOUS | Status: AC
  Administered 2011-03-01 – 2011-03-02 (×2): 20 mg via INTRAVENOUS
  Filled 2011-03-01: qty 50

## 2011-03-01 MED ORDER — CHLORHEXIDINE GLUCONATE 4 % EX LIQD: 60.0000 mL | Freq: Once | CUTANEOUS | Status: AC

## 2011-03-01 MED ORDER — MORPHINE SULFATE 4 MG/ML IJ SOLN
2.0000 mg | INTRAMUSCULAR | Status: DC | PRN
  Administered 2011-03-01 – 2011-03-03 (×9): 4 mg via INTRAVENOUS
  Filled 2011-03-01 (×9): qty 1

## 2011-03-01 MED ORDER — MIDAZOLAM HCL 2 MG/2ML IJ SOLN
2.0000 mg | INTRAMUSCULAR | Status: DC | PRN
  Administered 2011-03-02: 2 mg via INTRAVENOUS
  Filled 2011-03-01: qty 2

## 2011-03-01 MED ORDER — SODIUM CHLORIDE 0.9 % IV SOLN: INTRAVENOUS | Status: DC

## 2011-03-01 MED ORDER — SODIUM CHLORIDE 0.9 % IV SOLN
INTRAVENOUS | Status: DC
  Administered 2011-03-01: 20 mL/h via INTRAVENOUS

## 2011-03-01 MED ORDER — OXYCODONE HCL 5 MG PO TABS
5.0000 mg | ORAL_TABLET | ORAL | Status: DC | PRN
  Administered 2011-03-02: 10 mg via ORAL
  Administered 2011-03-02: 5 mg via ORAL
  Administered 2011-03-03 (×2): 10 mg via ORAL
  Filled 2011-03-01: qty 2
  Filled 2011-03-01: qty 1
  Filled 2011-03-01 (×2): qty 2

## 2011-03-01 MED ORDER — NITROGLYCERIN IN D5W 200-5 MCG/ML-% IV SOLN: 0.0000 ug/min | INTRAVENOUS | Status: DC

## 2011-03-01 MED ORDER — HEMOSTATIC AGENTS (NO CHARGE) OPTIME
TOPICAL | Status: DC | PRN
  Administered 2011-03-01: 3 via TOPICAL

## 2011-03-01 MED ORDER — LACTATED RINGERS IV SOLN
INTRAVENOUS | Status: DC | PRN
  Administered 2011-03-01: 16:00:00 via INTRAVENOUS

## 2011-03-01 MED ORDER — ALBUTEROL SULFATE (5 MG/ML) 0.5% IN NEBU
2.5000 mg | INHALATION_SOLUTION | Freq: Once | RESPIRATORY_TRACT | Status: AC
  Administered 2011-03-01: 2.5 mg via RESPIRATORY_TRACT

## 2011-03-01 MED ORDER — METOPROLOL TARTRATE 12.5 MG HALF TABLET
12.5000 mg | ORAL_TABLET | Freq: Once | ORAL | Status: DC
  Filled 2011-03-01: qty 1

## 2011-03-01 MED ORDER — SODIUM CHLORIDE 0.9 % IV SOLN
0.1000 ug/kg/h | INTRAVENOUS | Status: DC
  Administered 2011-03-02: 0.7 ug/kg/h via INTRAVENOUS
  Filled 2011-03-01 (×2): qty 2

## 2011-03-01 MED ORDER — DOCUSATE SODIUM 100 MG PO CAPS
200.0000 mg | ORAL_CAPSULE | Freq: Every day | ORAL | Status: DC
  Administered 2011-03-02 – 2011-03-03 (×2): 200 mg via ORAL
  Filled 2011-03-01 (×2): qty 2

## 2011-03-01 MED ORDER — SODIUM CHLORIDE 0.45 % IV SOLN
INTRAVENOUS | Status: DC
  Administered 2011-03-01: 20 mL/h via INTRAVENOUS

## 2011-03-01 MED ORDER — FENTANYL CITRATE 0.05 MG/ML IJ SOLN
INTRAMUSCULAR | Status: DC | PRN
  Administered 2011-03-01: 250 ug via INTRAVENOUS
  Administered 2011-03-01 (×2): 100 ug via INTRAVENOUS
  Administered 2011-03-01: 250 ug via INTRAVENOUS
  Administered 2011-03-01: 50 ug via INTRAVENOUS
  Administered 2011-03-01: 250 ug via INTRAVENOUS
  Administered 2011-03-01: 500 ug via INTRAVENOUS
  Administered 2011-03-01: 250 ug via INTRAVENOUS
  Administered 2011-03-01: 50 ug via INTRAVENOUS
  Administered 2011-03-01: 100 ug via INTRAVENOUS
  Administered 2011-03-01: 50 ug via INTRAVENOUS
  Administered 2011-03-01: 250 ug via INTRAVENOUS
  Administered 2011-03-01 (×2): 50 ug via INTRAVENOUS
  Administered 2011-03-01: 100 ug via INTRAVENOUS

## 2011-03-01 MED ORDER — MORPHINE SULFATE 2 MG/ML IJ SOLN: 1.0000 mg | INTRAMUSCULAR | Status: AC | PRN

## 2011-03-01 MED ORDER — ACETAMINOPHEN 160 MG/5ML PO SOLN: 650.0000 mg | ORAL | Status: AC

## 2011-03-01 MED ORDER — ACETAMINOPHEN 650 MG RE SUPP
650.0000 mg | RECTAL | Status: AC
  Administered 2011-03-01: 650 mg via RECTAL

## 2011-03-01 MED ORDER — 0.9 % SODIUM CHLORIDE (POUR BTL) OPTIME
TOPICAL | Status: DC | PRN
  Administered 2011-03-01: 6000 mL

## 2011-03-01 MED ORDER — PANTOPRAZOLE SODIUM 40 MG PO TBEC
40.0000 mg | DELAYED_RELEASE_TABLET | Freq: Every day | ORAL | Status: DC
  Administered 2011-03-03: 40 mg via ORAL
  Filled 2011-03-01: qty 1

## 2011-03-01 MED ORDER — ONDANSETRON HCL 4 MG/2ML IJ SOLN: 4.0000 mg | Freq: Four times a day (QID) | INTRAMUSCULAR | Status: DC | PRN

## 2011-03-01 MED ORDER — SODIUM CHLORIDE 0.9 % IJ SOLN
3.0000 mL | INTRAMUSCULAR | Status: DC | PRN
  Administered 2011-03-03: 3 mL via INTRAVENOUS

## 2011-03-01 MED ORDER — ALPRAZOLAM 0.25 MG PO TABS
0.2500 mg | ORAL_TABLET | Freq: Two times a day (BID) | ORAL | Status: DC | PRN
  Administered 2011-03-01 (×2): 0.25 mg via ORAL
  Filled 2011-03-01 (×2): qty 1

## 2011-03-01 MED ORDER — METOPROLOL TARTRATE 1 MG/ML IV SOLN
2.5000 mg | INTRAVENOUS | Status: DC | PRN
  Administered 2011-03-02: 5 mg via INTRAVENOUS

## 2011-03-01 MED ORDER — INSULIN REGULAR BOLUS VIA INFUSION: 0.0000 [IU] | Freq: Three times a day (TID) | INTRAVENOUS | Status: DC

## 2011-03-01 MED ORDER — VECURONIUM BROMIDE 10 MG IV SOLR
INTRAVENOUS | Status: DC | PRN
  Administered 2011-03-01 (×2): 10 mg via INTRAVENOUS
  Administered 2011-03-01: 5 mg via INTRAVENOUS

## 2011-03-01 MED ORDER — CHLORHEXIDINE GLUCONATE 4 % EX LIQD
60.0000 mL | Freq: Once | CUTANEOUS | Status: AC
  Administered 2011-03-01: 4 via TOPICAL
  Filled 2011-03-01: qty 15
  Filled 2011-03-01: qty 60

## 2011-03-01 MED ORDER — METOPROLOL TARTRATE 25 MG/10 ML ORAL SUSPENSION
12.5000 mg | Freq: Two times a day (BID) | ORAL | Status: DC
  Filled 2011-03-01 (×4): qty 5

## 2011-03-01 MED ORDER — SODIUM CHLORIDE 0.9 % IV SOLN: 250.0000 mL | INTRAVENOUS | Status: DC

## 2011-03-01 MED ORDER — BISACODYL 10 MG RE SUPP: 10.0000 mg | Freq: Every day | RECTAL | Status: DC

## 2011-03-01 MED ORDER — LACTATED RINGERS IV SOLN
INTRAVENOUS | Status: DC | PRN
  Administered 2011-03-01 (×2): via INTRAVENOUS

## 2011-03-01 MED ORDER — CHLORHEXIDINE GLUCONATE 4 % EX LIQD: 60.0000 mL | Freq: Once | CUTANEOUS | Status: DC

## 2011-03-01 MED ORDER — ACETAMINOPHEN 160 MG/5ML PO SOLN
975.0000 mg | Freq: Four times a day (QID) | ORAL | Status: DC
  Administered 2011-03-02: 975 mg
  Filled 2011-03-01 (×2): qty 40.6

## 2011-03-01 MED ORDER — PROPOFOL 10 MG/ML IV EMUL
INTRAVENOUS | Status: DC | PRN
  Administered 2011-03-01: 150 mg via INTRAVENOUS

## 2011-03-01 MED ORDER — LACTATED RINGERS IV SOLN: INTRAVENOUS | Status: DC

## 2011-03-01 MED ORDER — PROTAMINE SULFATE 10 MG/ML IV SOLN
INTRAVENOUS | Status: DC | PRN
  Administered 2011-03-01 (×2): 150 mg via INTRAVENOUS

## 2011-03-01 MED ORDER — ASPIRIN 81 MG PO CHEW: 324.0000 mg | CHEWABLE_TABLET | Freq: Every day | ORAL | Status: DC

## 2011-03-01 MED ORDER — VANCOMYCIN HCL 1000 MG IV SOLR
1000.0000 mg | Freq: Once | INTRAVENOUS | Status: AC
  Administered 2011-03-02: 1000 mg via INTRAVENOUS
  Filled 2011-03-01: qty 1000

## 2011-03-01 MED ORDER — SODIUM CHLORIDE 0.9 % IJ SOLN
3.0000 mL | Freq: Two times a day (BID) | INTRAMUSCULAR | Status: DC
  Administered 2011-03-02 – 2011-03-03 (×2): 3 mL via INTRAVENOUS

## 2011-03-01 MED ORDER — ACETAMINOPHEN 500 MG PO TABS
1000.0000 mg | ORAL_TABLET | Freq: Four times a day (QID) | ORAL | Status: DC
  Administered 2011-03-02 – 2011-03-03 (×5): 1000 mg via ORAL
  Filled 2011-03-01 (×8): qty 2

## 2011-03-01 MED ORDER — BISACODYL 5 MG PO TBEC
10.0000 mg | DELAYED_RELEASE_TABLET | Freq: Every day | ORAL | Status: DC
  Administered 2011-03-02 – 2011-03-03 (×2): 10 mg via ORAL
  Filled 2011-03-01 (×2): qty 2

## 2011-03-01 MED ORDER — METOPROLOL TARTRATE 12.5 MG HALF TABLET
12.5000 mg | ORAL_TABLET | Freq: Once | ORAL | Status: AC
  Administered 2011-03-01: 12.5 mg via ORAL
  Filled 2011-03-01: qty 1

## 2011-03-01 MED ORDER — TEMAZEPAM 15 MG PO CAPS: 15.0000 mg | ORAL_CAPSULE | Freq: Once | ORAL | Status: DC | PRN

## 2011-03-01 MED ORDER — LACTATED RINGERS IV SOLN: 500.0000 mL | Freq: Once | INTRAVENOUS | Status: AC | PRN

## 2011-03-01 MED ORDER — PHENYLEPHRINE HCL 10 MG/ML IJ SOLN: 0.0000 ug/min | INTRAVENOUS | Status: DC

## 2011-03-01 MED ORDER — POTASSIUM CHLORIDE 10 MEQ/50ML IV SOLN
10.0000 meq | INTRAVENOUS | Status: AC
  Administered 2011-03-01 – 2011-03-02 (×3): 10 meq via INTRAVENOUS

## 2011-03-01 MED ORDER — ALBUMIN HUMAN 5 % IV SOLN: 250.0000 mL | INTRAVENOUS | Status: AC | PRN

## 2011-03-01 MED ORDER — DEXTROSE 5 % IV SOLN
1.5000 g | Freq: Two times a day (BID) | INTRAVENOUS | Status: DC
  Administered 2011-03-02 – 2011-03-03 (×3): 1.5 g via INTRAVENOUS
  Filled 2011-03-01 (×4): qty 1.5

## 2011-03-01 MED ORDER — MAGNESIUM SULFATE 40 MG/ML IJ SOLN
4.0000 g | Freq: Once | INTRAMUSCULAR | Status: AC
  Administered 2011-03-01: 4 g via INTRAVENOUS

## 2011-03-01 MED ORDER — THROMBIN 20000 UNITS EX KIT
PACK | CUTANEOUS | Status: DC | PRN
  Administered 2011-03-01: 20000 [IU] via TOPICAL

## 2011-03-01 MED ORDER — MAGNESIUM SULFATE 40 MG/ML IJ SOLN
INTRAMUSCULAR | Status: AC
  Administered 2011-03-01: 4 g via INTRAVENOUS
  Filled 2011-03-01: qty 100

## 2011-03-01 MED ORDER — ASPIRIN EC 325 MG PO TBEC
325.0000 mg | DELAYED_RELEASE_TABLET | Freq: Every day | ORAL | Status: DC
  Administered 2011-03-02 – 2011-03-03 (×2): 325 mg via ORAL
  Filled 2011-03-01 (×2): qty 1

## 2011-03-01 SURGICAL SUPPLY — 108 items
ADAPTER CARDIO PERF ANTE/RETRO (ADAPTER) IMPLANT
ATTRACTOMAT 16X20 MAGNETIC DRP (DRAPES) ×2 IMPLANT
BAG DECANTER FOR FLEXI CONT (MISCELLANEOUS) ×2 IMPLANT
BANDAGE ACE 4 STERILE (GAUZE/BANDAGES/DRESSINGS) ×2 IMPLANT
BANDAGE ELASTIC 4 VELCRO ST LF (GAUZE/BANDAGES/DRESSINGS) ×2 IMPLANT
BANDAGE ELASTIC 6 VELCRO ST LF (GAUZE/BANDAGES/DRESSINGS) ×2 IMPLANT
BANDAGE GAUZE ELAST BULKY 4 IN (GAUZE/BANDAGES/DRESSINGS) ×2 IMPLANT
BASKET HEART (ORDER IN 25'S) (MISCELLANEOUS) ×1
BASKET HEART (ORDER IN 25S) (MISCELLANEOUS) ×1 IMPLANT
BLADE SAW STERNAL (BLADE) ×2 IMPLANT
BLADE SURG 11 STRL SS (BLADE) ×2 IMPLANT
BLADE SURG ROTATE 9660 (MISCELLANEOUS) IMPLANT
CANISTER SUCTION 2500CC (MISCELLANEOUS) ×2 IMPLANT
CANNULA GUNDRY RCSP 15FR (MISCELLANEOUS) IMPLANT
CATH ROBINSON RED A/P 18FR (CATHETERS) ×4 IMPLANT
CATH THORACIC 28FR (CATHETERS) ×2 IMPLANT
CATH THORACIC 28FR RT ANG (CATHETERS) IMPLANT
CATH THORACIC 36FR (CATHETERS) ×2 IMPLANT
CATH THORACIC 36FR RT ANG (CATHETERS) ×2 IMPLANT
CLIP FOGARTY SPRING 6M (CLIP) IMPLANT
CLIP TI MEDIUM 24 (CLIP) IMPLANT
CLIP TI WIDE RED SMALL 24 (CLIP) ×2 IMPLANT
CLOTH BEACON ORANGE TIMEOUT ST (SAFETY) ×2 IMPLANT
COVER SURGICAL LIGHT HANDLE (MISCELLANEOUS) ×4 IMPLANT
CRADLE DONUT ADULT HEAD (MISCELLANEOUS) ×2 IMPLANT
DRAPE CARDIOVASCULAR INCISE (DRAPES) ×1
DRAPE SLUSH MACHINE 52X66 (DRAPES) IMPLANT
DRAPE SLUSH/WARMER DISC (DRAPES) IMPLANT
DRAPE SRG 135X102X78XABS (DRAPES) ×1 IMPLANT
DRSG COVADERM 4X14 (GAUZE/BANDAGES/DRESSINGS) ×2 IMPLANT
ELECT CAUTERY BLADE 6.4 (BLADE) ×2 IMPLANT
ELECT REM PT RETURN 9FT ADLT (ELECTROSURGICAL) ×4
ELECTRODE REM PT RTRN 9FT ADLT (ELECTROSURGICAL) ×2 IMPLANT
GLOVE BIO SURGEON STRL SZ 6 (GLOVE) IMPLANT
GLOVE BIO SURGEON STRL SZ 6.5 (GLOVE) IMPLANT
GLOVE BIO SURGEON STRL SZ7 (GLOVE) IMPLANT
GLOVE BIO SURGEON STRL SZ7.5 (GLOVE) IMPLANT
GLOVE BIOGEL PI IND STRL 6 (GLOVE) IMPLANT
GLOVE BIOGEL PI IND STRL 6.5 (GLOVE) IMPLANT
GLOVE BIOGEL PI IND STRL 7.0 (GLOVE) IMPLANT
GLOVE BIOGEL PI INDICATOR 6 (GLOVE)
GLOVE BIOGEL PI INDICATOR 6.5 (GLOVE)
GLOVE BIOGEL PI INDICATOR 7.0 (GLOVE)
GLOVE EUDERMIC 7 POWDERFREE (GLOVE) ×4 IMPLANT
GLOVE ORTHO TXT STRL SZ7.5 (GLOVE) IMPLANT
GOWN PREVENTION PLUS XLARGE (GOWN DISPOSABLE) ×2 IMPLANT
GOWN STRL NON-REIN LRG LVL3 (GOWN DISPOSABLE) ×8 IMPLANT
HEMOSTAT POWDER SURGIFOAM 1G (HEMOSTASIS) ×4 IMPLANT
HEMOSTAT SURGICEL 2X14 (HEMOSTASIS) ×2 IMPLANT
INSERT FOGARTY 61MM (MISCELLANEOUS) IMPLANT
INSERT FOGARTY XLG (MISCELLANEOUS) IMPLANT
KIT BASIN OR (CUSTOM PROCEDURE TRAY) ×2 IMPLANT
KIT CATH CPB BARTLE (MISCELLANEOUS) ×2 IMPLANT
KIT ROOM TURNOVER OR (KITS) ×2 IMPLANT
KIT SUCTION CATH 14FR (SUCTIONS) ×2 IMPLANT
KIT VASOVIEW W/TROCAR VH 2000 (KITS) ×2 IMPLANT
NS IRRIG 1000ML POUR BTL (IV SOLUTION) ×10 IMPLANT
PACK OPEN HEART (CUSTOM PROCEDURE TRAY) ×2 IMPLANT
PAD ARMBOARD 7.5X6 YLW CONV (MISCELLANEOUS) ×4 IMPLANT
PENCIL BUTTON HOLSTER BLD 10FT (ELECTRODE) ×2 IMPLANT
PUNCH AORTIC ROTATE 4.0MM (MISCELLANEOUS) ×2 IMPLANT
PUNCH AORTIC ROTATE 4.5MM 8IN (MISCELLANEOUS) IMPLANT
PUNCH AORTIC ROTATE 5MM 8IN (MISCELLANEOUS) IMPLANT
SET CARDIOPLEGIA MPS 5001102 (MISCELLANEOUS) ×2 IMPLANT
SOLUTION ANTI FOG 6CC (MISCELLANEOUS) IMPLANT
SPONGE GAUZE 4X4 12PLY (GAUZE/BANDAGES/DRESSINGS) ×4 IMPLANT
SPONGE INTESTINAL PEANUT (DISPOSABLE) IMPLANT
SPONGE LAP 18X18 X RAY DECT (DISPOSABLE) IMPLANT
SPONGE LAP 4X18 X RAY DECT (DISPOSABLE) ×2 IMPLANT
SUT BONE WAX W31G (SUTURE) ×2 IMPLANT
SUT MNCRL AB 4-0 PS2 18 (SUTURE) ×2 IMPLANT
SUT PROLENE 3 0 SH DA (SUTURE) IMPLANT
SUT PROLENE 3 0 SH1 36 (SUTURE) IMPLANT
SUT PROLENE 4 0 RB 1 (SUTURE)
SUT PROLENE 4 0 SH DA (SUTURE) IMPLANT
SUT PROLENE 4-0 RB1 .5 CRCL 36 (SUTURE) IMPLANT
SUT PROLENE 5 0 C 1 36 (SUTURE) IMPLANT
SUT PROLENE 6 0 C 1 30 (SUTURE) ×2 IMPLANT
SUT PROLENE 6 0 CC (SUTURE) IMPLANT
SUT PROLENE 7 0 BV 1 (SUTURE) IMPLANT
SUT PROLENE 7 0 BV1 MDA (SUTURE) ×4 IMPLANT
SUT PROLENE 7.0 RB 3 (SUTURE) ×2 IMPLANT
SUT PROLENE 8 0 BV175 6 (SUTURE) ×2 IMPLANT
SUT SILK  1 MH (SUTURE)
SUT SILK 1 MH (SUTURE) IMPLANT
SUT SILK 2 0 SH CR/8 (SUTURE) IMPLANT
SUT SILK 3 0 SH CR/8 (SUTURE) IMPLANT
SUT STEEL STERNAL CCS#1 18IN (SUTURE) IMPLANT
SUT STEEL SZ 6 DBL 3X14 BALL (SUTURE) IMPLANT
SUT VIC AB 1 CTX 36 (SUTURE) ×2
SUT VIC AB 1 CTX36XBRD ANBCTR (SUTURE) ×2 IMPLANT
SUT VIC AB 2-0 CT1 27 (SUTURE) ×1
SUT VIC AB 2-0 CT1 TAPERPNT 27 (SUTURE) ×1 IMPLANT
SUT VIC AB 2-0 CTX 27 (SUTURE) IMPLANT
SUT VIC AB 3-0 SH 27 (SUTURE)
SUT VIC AB 3-0 SH 27X BRD (SUTURE) IMPLANT
SUT VIC AB 3-0 X1 27 (SUTURE) IMPLANT
SUT VICRYL 4-0 PS2 18IN ABS (SUTURE) IMPLANT
SUTURE E-PAK OPEN HEART (SUTURE) ×2 IMPLANT
SYSTEM SAHARA CHEST DRAIN ATS (WOUND CARE) ×2 IMPLANT
TAPE CLOTH SURG 4X10 WHT LF (GAUZE/BANDAGES/DRESSINGS) ×2 IMPLANT
TOWEL OR 17X24 6PK STRL BLUE (TOWEL DISPOSABLE) ×2 IMPLANT
TOWEL OR 17X26 10 PK STRL BLUE (TOWEL DISPOSABLE) ×2 IMPLANT
TRAY FOLEY IC TEMP SENS 14FR (CATHETERS) ×2 IMPLANT
TUBE SUCT INTRACARD DLP 20F (MISCELLANEOUS) ×2 IMPLANT
TUBING INSUFFLATION 10FT LAP (TUBING) ×2 IMPLANT
UNDERPAD 30X30 INCONTINENT (UNDERPADS AND DIAPERS) ×2 IMPLANT
WATER STERILE IRR 1000ML POUR (IV SOLUTION) ×4 IMPLANT

## 2011-03-01 NOTE — Brief Op Note (Signed)
02/26/2011 - 03/01/2011  9:36 PM  PATIENT:  Joel Santiago  71 y.o. male  PRE-OPERATIVE DIAGNOSIS:  Coronary Artery Disease  POST-OPERATIVE DIAGNOSIS:  Coronary Artery Disease  PROCEDURE:  Procedure(s): CORONARY ARTERY BYPASS GRAFTING (CABG) X 5  LIMA to LAD SVG to Diag SVG to OM Seq SVG to PDA and PL  EVH left leg  SURGEON:  Surgeon(s): Alleen Borne, MD  PHYSICIAN ASSISTANT: none  ASSISTANTS: Al Corpus, SA-C  ANESTHESIA:   general  EBL:  Total I/O In: 1470 [I.V.:800; Blood:670] Out: 1775 [Urine:375; Blood:1400]  BLOOD ADMINISTERED:none  Off bypass on no inotropes  To SICU, stable

## 2011-03-01 NOTE — Anesthesia Preprocedure Evaluation (Signed)
Anesthesia Evaluation  Patient identified by MRN, date of birth, ID band Patient awake    Reviewed: Allergy & Precautions, H&P , NPO status , Patient's Chart, lab work & pertinent test results  Airway Mallampati: II  Neck ROM: full    Dental   Pulmonary former smoker         Cardiovascular hypertension, + angina + CAD     Neuro/Psych CVA    GI/Hepatic   Endo/Other  Diabetes mellitus-  Renal/GU      Musculoskeletal   Abdominal   Peds  Hematology   Anesthesia Other Findings   Reproductive/Obstetrics                           Anesthesia Physical Anesthesia Plan  ASA: III  Anesthesia Plan: General   Post-op Pain Management:    Induction: Intravenous  Airway Management Planned: Oral ETT  Additional Equipment: Arterial line, CVP and PA Cath  Intra-op Plan:   Post-operative Plan: Post-operative intubation/ventilation  Informed Consent: I have reviewed the patients History and Physical, chart, labs and discussed the procedure including the risks, benefits and alternatives for the proposed anesthesia with the patient or authorized representative who has indicated his/her understanding and acceptance.     Plan Discussed with: CRNA and Surgeon  Anesthesia Plan Comments:         Anesthesia Quick Evaluation

## 2011-03-01 NOTE — Progress Notes (Addendum)
Pt c/o chest pain after many attempts to get out of bed. Pt becoming more agitated. ST noted on monitor with rate 104. Morphine not due at this time. Fellow on call paged.

## 2011-03-01 NOTE — Progress Notes (Signed)
Pt walking in hallway. Confused. Assisted back to room and bed. Pt reoriented to environment.  Bed alarm on. Will monitor.

## 2011-03-01 NOTE — Transfer of Care (Signed)
Immediate Anesthesia Transfer of Care Note  Patient: Joel Santiago  Procedure(s) Performed:  CORONARY ARTERY BYPASS GRAFTING (CABG) - Coronary artery bypass graft times five on pump using left internal mammary artery and right greater saphenous vein via endovein harvest.  Patient Location: PACU and SICU  Anesthesia Type: General  Level of Consciousness: sedated  Airway & Oxygen Therapy: Patient remains intubated per anesthesia plan  Post-op Assessment: Report to SICU RN  Post vital signs: Reviewed  Complications: No apparent anesthesia complications

## 2011-03-01 NOTE — Anesthesia Postprocedure Evaluation (Signed)
Anesthesia Post Note  Patient: Joel Santiago  Procedure(s) Performed:  CORONARY ARTERY BYPASS GRAFTING (CABG) - Coronary artery bypass graft times five on pump using left internal mammary artery and right greater saphenous vein via endovein harvest.  Anesthesia type: General  Patient location: ICU  Post pain: Pain level controlled  Post assessment: Post-op Vital signs reviewed  Last Vitals:  Filed Vitals:   03/01/11 2200  BP: 120/62  Pulse: 92  Temp:   Resp: 12    Post vital signs: stable  Level of consciousness: Patient remains intubated per anesthesia plan  Complications: No apparent anesthesia complications

## 2011-03-01 NOTE — Progress Notes (Signed)
SUBJECTIVE:  Had significant chest pain last PM but now pain free  OBJECTIVE:   Vitals:   Filed Vitals:   03/01/11 0500 03/01/11 0600 03/01/11 0700 03/01/11 0745  BP: 124/62 126/62 127/88   Pulse: 87 108    Temp:    99.6 F (37.6 C)  TempSrc:    Oral  Resp:      Height:      Weight: 84.5 kg (186 lb 4.6 oz)     SpO2: 96% 93%     I&O's:   Intake/Output Summary (Last 24 hours) at 03/01/11 0835 Last data filed at 03/01/11 0745  Gross per 24 hour  Intake 1372.85 ml  Output   2850 ml  Net -1477.15 ml   TELEMETRY: Reviewed telemetry pt in NSR     PHYSICAL EXAM General: Well developed, well nourished, in no acute distress Head: Eyes PERRLA, No xanthomas.   Normal cephalic and atramatic  Lungs:   Clear bilaterally to auscultation and percussion. Heart:   HRRR S1 S2 Pulses are 2+ & equal.            No carotid bruit. No JVD.  No abdominal bruits. No femoral bruits. Abdomen: Bowel sounds are positive, abdomen soft and non-tender without masses Extremities:   No clubbing, cyanosis or edema.  DP +1 Neuro: Alert and oriented X 3. Psych:  Good affect, responds appropriately   LABS: Basic Metabolic Panel:  Basename 02/27/11 0917 02/26/11 2010  NA 141 137  K 4.1 4.5  CL 101 101  CO2 29 29  GLUCOSE 182* 122*  BUN 8 10  CREATININE 0.65 0.69  CALCIUM 9.2 9.2  MG -- --  PHOS -- --   Liver Function Tests:  [facility] 02/26/11 2010  AST 21  ALT 39  ALKPHOS 69  BILITOT 0.3  PROT 7.5  ALBUMIN 3.6   No results found for this basename: LIPASE:2,AMYLASE:2 in the last 72 hours CBC:  Basename 03/01/11 0232 02/28/11 0430 02/26/11 2010  WBC 7.0 7.3 --  NEUTROABS -- -- 3.4  HGB 12.6* 13.8 --  HCT 38.9* 44.3 --  MCV 86.1 87.4 --  PLT 187 201 --   Cardiac Enzymes:  Basename 02/27/11 0917 02/26/11 2338 02/26/11 2011  CKTOTAL 217 188 186  CKMB 6.3* 6.0* 5.3*  CKMBINDEX -- -- --  TROPONINI 0.41* 0.38* <0.30   Hemoglobin A1C:  Basename 02/27/11 0917  HGBA1C 5.9*    Fasting Lipid Panel:  Basename 02/27/11 0917  CHOL 200  HDL 45  LDLCALC 138*  TRIG 87  CHOLHDL 4.4  LDLDIRECT --    Coag Panel:   Lab Results  Component Value Date   INR 1.07 02/27/2011   INR 1.05 02/26/2011    RADIOLOGY: Dg Abd Acute W/chest  02/26/2011  *RADIOLOGY REPORT*  Clinical Data: Generalized chest and abdominal pain.  ACUTE ABDOMEN SERIES (ABDOMEN 2 VIEW & CHEST 1 VIEW)  Comparison: None.  Findings: The heart size is at the upper limits of normal.  The lungs are clear.  Supine and upright views the abdomen demonstrates moderate stool throughout the colon.  No obstruction or free air is present.  Degenerative changes are present in the lower lumbar spine.  IMPRESSION:  1.  Borderline cardiomegaly without failure. 2.  Moderate stool throughout the colon. 3.  No acute abnormality of the chest or abdomen.  Original Report Authenticated By: Jamesetta Orleans. MATTERN, M.D.      ASSESSMENT:  1. S/p NSTEMI with cath showing severe 3 vessel ASCAD - had  recurrent CP last PM  2. DM  3. HTN  4. CVA   PLAN:   CABG today by Dr. Sherrye Payor, MD  03/01/2011  8:35 AM

## 2011-03-01 NOTE — Progress Notes (Addendum)
VASCULAR LAB PRELIMINARY  PRELIMINARY  PRELIMINARY  PRELIMINARY   Bilateral carotid artery duplex completed.  Preliminary report is no evidence of significant ICA stenosis.  Vertebral artery flow is antegrade.  ABI is 0.71 on the right and 0.73 on the left. Joel Santiago, 03/01/2011, 9:23 AM

## 2011-03-01 NOTE — Progress Notes (Signed)
[place] PA notified of pts confusion during the night and agitation this am. New order obtained.

## 2011-03-01 NOTE — Progress Notes (Signed)
ANTICOAGULATION CONSULT NOTE - Follow Up Consult  Pharmacy Consult for heparin Indication: CAD, awaiting CABG  Labs:  Basename 03/01/11 0232 02/28/11 0430 02/27/11 2030 02/27/11 0917 02/26/11 2338 02/26/11 2011 02/26/11 2010  HGB 12.6* 13.8 -- -- -- -- --  HCT 38.9* 44.3 -- 44.1 -- -- --  PLT 187 201 -- 175 -- -- --  APTT -- -- -- -- 67* -- --  LABPROT -- -- -- 14.1 13.9 -- --  INR -- -- -- 1.07 1.05 -- --  HEPARINUNFRC 0.25* 0.41 0.16* -- -- -- --  CREATININE -- -- -- 0.65 -- -- 0.69  CKTOTAL -- -- -- 217 188 186 --  CKMB -- -- -- 6.3* 6.0* 5.3* --  TROPONINI -- -- -- 0.41* 0.38* <0.30 --   Assessment: 71yo male dropped to below therapeutic on heparin after one therapeutic level at current rate.  Planned for CABG later today.  Goal of Therapy:  Heparin level 0.3-0.7 units/ml   Plan:  Will increase gtt to 1900 units/hr and check level in 6hr.  Colleen Can PharmD BCPS 03/01/2011,3:48 AM

## 2011-03-01 NOTE — Preoperative (Signed)
Beta Blockers   Reason not to administer Beta Blockers:Not Applicable, took Metoprolol this am 

## 2011-03-01 NOTE — Progress Notes (Signed)
Pt continues to be confused and disoriented to environment. Pt continues to get out of bed with out calling for assistance with high risk of pulling out IV lines. Bed alarm on middle setting. Call bell in reach. Nurse sitting outside patient room for frequent observation. VSS. No other deficit in neuro status noted.

## 2011-03-01 NOTE — Progress Notes (Signed)
ANTICOAGULATION CONSULT NOTE - Follow Up Consult  Pharmacy Consult for heparin Indication: CAD, awaiting CABG today  Labs:  Basename 03/01/11 0938 03/01/11 0232 02/28/11 0430 02/27/11 0917 02/26/11 2338 02/26/11 2011 02/26/11 2010  HGB 12.7* 12.6* -- -- -- -- --  HCT 39.1 38.9* 44.3 -- -- -- --  PLT 184 187 201 -- -- -- --  APTT -- -- -- -- 67* -- --  LABPROT -- -- -- 14.1 13.9 -- --  INR -- -- -- 1.07 1.05 -- --  HEPARINUNFRC 0.20* 0.25* 0.41 -- -- -- --  CREATININE -- -- -- 0.65 -- -- 0.69  CKTOTAL -- -- -- 217 188 186 --  CKMB -- -- -- 6.3* 6.0* 5.3* --  TROPONINI -- -- -- 0.41* 0.38* <0.30 --   Assessment: 71 yo male on IV heparin at 1900 units/hr with subtherapeutic heparin level.  Awaiting CABG later today.  No bleeding noted per chart notes.  Goal of Therapy:  Heparin level 0.3-0.7 units/ml   Plan:  Will increase gtt to 2100 units/hr.  Will order 6 hr level in case surgery gets delayed, etc, otherwise f/u after OHS.  CarneyGwenlyn Found PharmD BCPS 03/01/2011,10:51 AM

## 2011-03-02 ENCOUNTER — Encounter (HOSPITAL_COMMUNITY): Payer: Self-pay | Admitting: Surgery

## 2011-03-02 ENCOUNTER — Inpatient Hospital Stay (HOSPITAL_COMMUNITY): Payer: Medicare Other

## 2011-03-02 LAB — GLUCOSE, CAPILLARY
Glucose-Capillary: 127 mg/dL — ABNORMAL HIGH (ref 70–99)
Glucose-Capillary: 135 mg/dL — ABNORMAL HIGH (ref 70–99)
Glucose-Capillary: 137 mg/dL — ABNORMAL HIGH (ref 70–99)
Glucose-Capillary: 142 mg/dL — ABNORMAL HIGH (ref 70–99)
Glucose-Capillary: 151 mg/dL — ABNORMAL HIGH (ref 70–99)
Glucose-Capillary: 71 mg/dL (ref 70–99)
Glucose-Capillary: 73 mg/dL (ref 70–99)

## 2011-03-02 LAB — BASIC METABOLIC PANEL
BUN: 7 mg/dL (ref 6–23)
CO2: 24 mEq/L (ref 19–32)
Chloride: 106 mEq/L (ref 96–112)
GFR calc Af Amer: >90 mL/min (ref >90)
Glucose, Bld: 149 mg/dL — ABNORMAL HIGH (ref 70–99)
Potassium: 4.4 mEq/L (ref 3.5–5.1)

## 2011-03-02 LAB — CREATININE, SERUM: GFR calc Af Amer: >90 mL/min (ref >90)

## 2011-03-02 LAB — CBC
HCT: 37.4 % — ABNORMAL LOW (ref 39.0–52.0)
HCT: 39.7 % (ref 39.0–52.0)
Hemoglobin: 12 g/dL — ABNORMAL LOW (ref 13.0–17.0)
MCH: 27.8 pg (ref 26.0–34.0)
MCV: 85.2 fL (ref 78.0–100.0)
MCV: 86.3 fL (ref 78.0–100.0)
RBC: 4.39 MIL/uL (ref 4.22–5.81)
RBC: 4.6 MIL/uL (ref 4.22–5.81)
RDW: 13.5 % (ref 11.5–15.5)
RDW: 13.8 % (ref 11.5–15.5)
WBC: 12.1 10*3/uL — ABNORMAL HIGH (ref 4.0–10.5)
WBC: 17.1 10*3/uL — ABNORMAL HIGH (ref 4.0–10.5)

## 2011-03-02 LAB — POCT I-STAT 3, ART BLOOD GAS (G3+)
Bicarbonate: 23.8 mEq/L (ref 20.0–24.0)
Bicarbonate: 23.6 mEq/L (ref 20.0–24.0)
O2 Saturation: 98 %
TCO2: 25 mmol/L (ref 0–100)
TCO2: 25 mmol/L (ref 0–100)
pCO2 arterial: 39.3 mmHg (ref 35.0–45.0)
pCO2 arterial: 41.6 mmHg (ref 35.0–45.0)
pH, Arterial: 7.388 (ref 7.350–7.450)
pH, Arterial: 7.384 (ref 7.350–7.450)
pH, Arterial: 7.368 (ref 7.350–7.450)
pO2, Arterial: 114 mmHg — ABNORMAL HIGH (ref 80.0–100.0)

## 2011-03-02 LAB — POCT I-STAT, CHEM 8
Glucose, Bld: 135 mg/dL — ABNORMAL HIGH (ref 70–99)
HCT: 43 % (ref 39.0–52.0)
Hemoglobin: 14.6 g/dL (ref 13.0–17.0)
Potassium: 3.8 mEq/L (ref 3.5–5.1)

## 2011-03-02 MED ORDER — INSULIN ASPART 100 UNIT/ML ~~LOC~~ SOLN
0.0000 [IU] | SUBCUTANEOUS | Status: DC
  Administered 2011-03-02 – 2011-03-03 (×6): 2 [IU] via SUBCUTANEOUS
  Administered 2011-03-03: 4 [IU] via SUBCUTANEOUS
  Filled 2011-03-02: qty 3

## 2011-03-02 MED ORDER — INSULIN ASPART 100 UNIT/ML ~~LOC~~ SOLN: 0.0000 [IU] | SUBCUTANEOUS | Status: DC

## 2011-03-02 MED ORDER — INSULIN GLARGINE 100 UNIT/ML ~~LOC~~ SOLN
15.0000 [IU] | Freq: Every day | SUBCUTANEOUS | Status: DC
  Filled 2011-03-02: qty 3

## 2011-03-02 MED ORDER — INSULIN GLARGINE 100 UNIT/ML ~~LOC~~ SOLN
15.0000 [IU] | SUBCUTANEOUS | Status: DC
  Administered 2011-03-02: 15 [IU] via SUBCUTANEOUS
  Filled 2011-03-02 (×2): qty 3

## 2011-03-02 MED ORDER — FUROSEMIDE 10 MG/ML IJ SOLN
40.0000 mg | Freq: Two times a day (BID) | INTRAMUSCULAR | Status: DC
  Administered 2011-03-02 – 2011-03-03 (×3): 40 mg via INTRAVENOUS
  Filled 2011-03-02 (×4): qty 4

## 2011-03-02 MED ORDER — INSULIN ASPART 100 UNIT/ML ~~LOC~~ SOLN
0.0000 [IU] | SUBCUTANEOUS | Status: AC
  Administered 2011-03-02 (×2): 2 [IU] via SUBCUTANEOUS
  Filled 2011-03-02: qty 3

## 2011-03-02 MED FILL — Sodium Bicarbonate IV Soln 8.4%: INTRAVENOUS | Qty: 50 | Status: AC

## 2011-03-02 MED FILL — Heparin Sodium (Porcine) Inj 1000 Unit/ML: INTRAMUSCULAR | Qty: 30 | Status: AC

## 2011-03-02 MED FILL — Lidocaine HCl IV Inj 20 MG/ML: INTRAVENOUS | Qty: 5 | Status: AC

## 2011-03-02 MED FILL — Heparin Sodium (Porcine) Inj 1000 Unit/ML: INTRAMUSCULAR | Qty: 3 | Status: AC

## 2011-03-02 MED FILL — Sodium Chloride IV Soln 0.9%: INTRAVENOUS | Qty: 1000 | Status: AC

## 2011-03-02 MED FILL — Electrolyte-R (PH 7.4) Solution: INTRAVENOUS | Qty: 4000 | Status: AC

## 2011-03-02 MED FILL — Mannitol IV Soln 20%: INTRAVENOUS | Qty: 500 | Status: AC

## 2011-03-02 MED FILL — Sodium Chloride Irrigation Soln 0.9%: Qty: 3000 | Status: AC

## 2011-03-02 NOTE — Progress Notes (Signed)
SUBJECTIVE:  Currently on CPAP trial for extubation  OBJECTIVE:   Vitals:   Filed Vitals:   03/02/11 0741 03/02/11 0745 03/02/11 0800 03/02/11 0803  BP: 158/77 134/79 111/66 121/62  Pulse: 105 103 103 102  Temp:  101.1 F (38.4 C) 101.5 F (38.6 C)   TempSrc:      Resp: 18 18 14 14   Height:      Weight:      SpO2: 98% 99% 100% 100%   I&O's:   Intake/Output Summary (Last 24 hours) at 03/02/11 0848 Last data filed at 03/02/11 4098  Gross per 24 hour  Intake 4155.3 ml  Output   5970 ml  Net -1814.7 ml   TELEMETRY: Reviewed telemetry pt in sinus tachycardia     PHYSICAL EXAM General:  intubated  Lungs:   Clear bilaterally to auscultation and percussion. Heart:   HRRR S1 S2 Pulses are 2+ & equal.            No carotid bruit. No JVD.  No abdominal bruits. No femoral bruits. Abdomen: Bowel sounds are positive, abdomen soft and non-tender without masses   LABS: Basic Metabolic Panel:  Basename 03/02/11 0355 03/01/11 2205 02/27/11 0917  NA 137 139 --  K 4.4 3.7 --  CL 106 -- 101  CO2 24 -- 29  GLUCOSE 149* 116* --  BUN 7 -- 8  CREATININE 0.61 -- 0.65  CALCIUM 7.9* -- 9.2  MG 2.5 -- --  PHOS -- -- --   Liver Function Tests: No results found for this basename: AST:2,ALT:2,ALKPHOS:2,BILITOT:2,PROT:2,ALBUMIN:2 in the last 72 hours No results found for this basename: LIPASE:2,AMYLASE:2 in the last 72 hours CBC:  Basename 03/02/11 0355 03/01/11 2225  WBC 12.1* 10.9*  NEUTROABS -- --  HGB 12.0* 11.4*  HCT 37.4* 35.2*  MCV 85.2 86.1  PLT 121* 110*   Cardiac Enzymes:  Basename 02/27/11 0917  CKTOTAL 217  CKMB 6.3*  CKMBINDEX --  TROPONINI 0.41*   BNP: No components found with this basename: POCBNP:3 D-Dimer: No results found for this basename: DDIMER:2 in the last 72 hours Hemoglobin A1C:  Basename 02/27/11 0917  HGBA1C 5.9*   Fasting Lipid Panel:  Basename 02/27/11 0917  CHOL 200  HDL 45  LDLCALC 138*  TRIG 87  CHOLHDL 4.4  LDLDIRECT --    Thyroid Function Tests: No results found for this basename: TSH,T4TOTAL,FREET3,T3FREE,THYROIDAB in the last 72 hours Anemia Panel: No results found for this basename: VITAMINB12,FOLATE,FERRITIN,TIBC,IRON,RETICCTPCT in the last 72 hours Coag Panel:   Lab Results  Component Value Date   INR 1.40 03/01/2011   INR 1.07 02/27/2011   INR 1.05 02/26/2011    RADIOLOGY: Dg Chest Portable 1 View In Am  03/02/2011  *RADIOLOGY REPORT*  Clinical Data: Status post CABG.  Shortness of breath.  PORTABLE CHEST - 1 VIEW  Comparison: Chest 03/01/2011.  Findings: Support tubes and lines are unchanged.  No pneumothorax identified.  Bibasilar subsegmental atelectasis is more notable on the left.  Heart size is upper normal.  IMPRESSION: No interval change.  Original Report Authenticated By: Bernadene Bell. D'ALESSIO, M.D.   Dg Chest Portable 1 View  03/01/2011  *RADIOLOGY REPORT*  Clinical Data: Coronary bypass grafting  PORTABLE CHEST - 1 VIEW  Comparison: 02/26/2011  Findings: Changes of interval CABG.  Endotracheal tube tip 6.5 cm above carina.  Left chest tube directed laterally with no pneumothorax evident.  Right IJ Swan-Ganz catheter to the proximal right pulmonary artery.  Nasogastric tube extends into the stomach. Mediastinal  drain is in place.  Lungs are clear.  No effusion. Heart size upper limits normal.  IMPRESSION:  1.  Changes of interval CABG with support hardware as above.  Original Report Authenticated By: Osa Craver, M.D.   Dg Abd Acute W/chest  02/26/2011  *RADIOLOGY REPORT*  Clinical Data: Generalized chest and abdominal pain.  ACUTE ABDOMEN SERIES (ABDOMEN 2 VIEW & CHEST 1 VIEW)  Comparison: None.  Findings: The heart size is at the upper limits of normal.  The lungs are clear.  Supine and upright views the abdomen demonstrates moderate stool throughout the colon.  No obstruction or free air is present.  Degenerative changes are present in the lower lumbar spine.  IMPRESSION:  1.  Borderline  cardiomegaly without failure. 2.  Moderate stool throughout the colon. 3.  No acute abnormality of the chest or abdomen.  Original Report Authenticated By: Jamesetta Orleans. MATTERN, M.D.      ASSESSMENT/PLAN:  1. S/p NSTEMI  2.  Severe 3 vessel ASCAD s/p CABG post op day #1 3. DM  4. HTN  5. CVA  Quintella Reichert, MD  03/02/2011  8:48 AM

## 2011-03-02 NOTE — Progress Notes (Signed)
Patient examined and record reviewed.Hemodynamics stable,labs satisfactory.Patient had stable day.Continue current care.  Some brief runs afib getting lopressor doses in line,mild confusion Joel Santiago,Joel Santiago 03/02/2011

## 2011-03-02 NOTE — Op Note (Signed)
Joel Santiago, Joel Santiago NO.:  [id number]  MEDICAL RECORD NO.:  [id number]  LOCATION:  2308                         FACILITY:  [facility]  PHYSICIAN:  Evelene Croon, M.D.     DATE OF BIRTH:  [birth year]  DATE OF PROCEDURE:  03/01/2011 DATE OF DISCHARGE:                              OPERATIVE REPORT   PREOPERATIVE DIAGNOSES:  Severe 3-vessel coronary artery disease with unstable angina, status post non ST-segment elevation myocardial infarction.  POSTOPERATIVE DIAGNOSES:  Severe 3-vessel coronary artery disease with unstable angina, status post non ST-segment elevation myocardial infarction.  OPERATIVE PROCEDURE:  Median sternotomy, extracorporeal circulation, coronary artery bypass graft surgery x5 using a left internal mammary artery graft to the left anterior descending coronary artery with a saphenous vein graft to diagonal branch of the LAD, a saphenous vein graft to the obtuse marginal branch of the left circumflex coronary artery, and a sequential saphenous vein graft to the posterior descending and posterolateral branches of the right coronary artery. Endoscopic vein harvesting from the left leg.  ATTENDING SURGEON:  Evelene Croon, M.D.  ASSISTANT:  Al Corpus, [facility]  CLINICAL HISTORY:  This patient is a 71 year old gentleman who presented with recurrent episodes of chest discomfort while picking up and moving boxes.  He has had a several year history of substernal chest discomfort with exertion.  When he presented with chest discomfort, he was treated with aspirin and heparin in the emergency room.  His initial troponin was 0.13.  Cardiac catheterization yesterday showed severe 3-vessel coronary disease with good left ventricular function with an ejection fraction of 50-55%.  The left main was calcified but patent.  There was about 80% mid LAD stenosis.  There was a large diagonal that had about 70% stenosis in the superior branch.  There were sequential 70  and 90% stenoses in the left circumflex before a large marginal branch.  The right coronary artery was a dominant vessel that was diffusely diseased with moderate proximal and mid vessel stenosis.  There was about 90% stenosis in the midportion of the posterior descending artery and there also appeared to be high-grade stenosis in the distal right coronary artery just at the takeoff of the posterolateral branch.  After the catheterization, the patient had some chest discomfort and had 2 recurrent episodes last night while sitting on the side to urinate and arranging the covers on his bed.  He was started on heparin and nitroglycerin and given morphine and beta-blocker and when I saw the patient, he was no longer having chest discomfort.  His electrocardiogram at time of chest discomfort was unchanged with no acute ischemic changes.  I agree that coronary artery bypass graft surgery is the best treatment but I felt that we could wait until today to do the surgery.  I discussed the operative procedure with the patient including alternatives, benefits, and risks including, but not limited to bleeding, blood transfusion, infection, stroke, myocardial infarction, graft failure, and death.  He understood and agreed to proceed.  OPERATIVE PROCEDURE:  The patient was taken to the operating room, placed on table in supine position.  After induction of general endotracheal anesthesia, a Foley catheter was placed in bladder using  sterile technique.  Then the chest, abdomen, and both lower extremities were prepped and draped in the usual sterile manner.  The chest was entered through a median sternotomy incision.  The pericardium opened midline.  Examination of the heart showed good ventricular contractility.  The ascending aorta was of normal size and had no palpable plaques in it.  The left internal mammary artery was harvested from chest wall as a pedicle graft.  This is a medium caliber  vessel with excellent blood flow through it.  At the same time, segment of greater saphenous vein was harvested from the left leg using endoscopic vein harvest technique. This vein was of medium size and good quality.  Then the patient was heparinized when an adequate ACT was obtained.  The distal ascending aorta was cannulated using a 20-French aortic cannula for arterial inflow.  Venous outflow was achieved using a 2-stage venous cannula for the right atrial appendage.  An antegrade cardioplegia and vent cannula was inserted in the aortic root.  The patient was placed on cardiopulmonary bypass and distal coronaries identified.  He had diffuse coronary plaque throughout all of these vessels.  The LAD was diffusely diseased, but near the apex appeared fairly soft.  It was graftable.  The diagonal branch likewise was heavily diseased and the superior subbranch had disease extending out to the bifurcation into 2 smaller subbranches.  One of these was chosen for grafting.  The obtuse marginal branch was also diffusely diseased but graftable.  The right coronary artery gave off small to moderate sized posterior descending and posterolateral branches.  The posterior descending was diseased throughout but distally was felt to be suitable for grafting, although there were still some disease there.  The posterolateral branch was diseased proximally but the remainder of the vessel was a fairly good vessel.  Then the aorta was crossclamped and 1000 mL of cold blood antegrade cardioplegia was administered in the aortic root with quick arrest of the heart.  Systemic hypothermia to 32 degrees centigrade and topical hypothermic iced saline was used.  Temperature probe was placed in septum insulating pad in the pericardium.  The first distal anastomosis was performed in the diagonal branch.  The internal diameter of the subbranch was about 1.5 mm.  The conduit used was a segment of greater  saphenous vein and the anastomosis was performed in end-to-side manner using continuous 7-0 Prolene suture. Flow was noted through the graft was excellent.  The second distal anastomosis was performed at the posterior descending coronary artery.  The internal diameter distally was about 1.5 mm.  The conduit used was a second segment of greater saphenous vein and the anastomosis was performed in a sequential side-to-side manner using continuous 7-0 Prolene suture.  Flow was noted through the graft and was excellent.  Third distal anastomosis was performed in the posterolateral branch. The internal diameter of this vessel was about 1.6 mm.  The conduit used was a same segment of greater saphenous vein and the anastomosis performed in a sequential end-to-side manner using continuous 7-0 Prolene suture.  Flow was noted through the graft and was excellent. Then, a dose of cardioplegia was given down the vein graft and aortic root.  The fourth distal anastomosis was performed to the obtuse marginal branch.  The internal diameter was about 1.75 mm.  The conduit used was a third segment of greater saphenous vein and the anastomosis performed in end-to-side manner using continuous 7-0 Prolene suture.  Flow was noted through the graft and  was excellent.  The fifth distal anastomosis was performed at the distal LAD.  The internal diameter was 1.75 mm.  The conduit used was a left internal mammary graft and this was brought through an opening in the left pericardium anterior to the phrenic nerve.  It was anastomosed to LAD in an end-to-side manner using continuous 8-0 Prolene suture.  The pedicle was sutured to the epicardium with 6-0 Prolene sutures.  Then, another dose of cardioplegia was given.  With crossclamp in place, the 3 proximal vein graft anastomoses were performed to the mid ascending aorta in an end-to-side manner using continuous 6-0 Prolene suture. Then, the clamp was removed  from the mammary pedicle.  There was rapid warming of the ventricular septum and return of spontaneous ventricular fibrillation.  Crossclamp was removed with the time of 86 minutes and the patient spontaneously returned to sinus rhythm.  The proximal and distal anastomoses appeared hemostatic and allowed the grafts satisfactory.  Graft markers were placed around the proximal anastomoses.  Two temporary right ventricular and right atrial pacing wires placed and brought out through the skin.  When the patient was rewarmed to 37 degrees centigrade, he was weaned from cardiopulmonary bypass on no inotropic agents.  Total bypass time was 104 minutes.  Cardiac function appeared excellent with cardiac output of 5 L/minute.  Protamine was given and the venous and aortic cannulas were removed without difficulty.  Hemostasis was achieved. Three chest tubes were placed with tube in the posterior pericardium, one in left pleural space, one in the anterior mediastinum.  The sternum was then reapproximated with double #6 stainless steel wires.  The fascia was closed with continuous #1 Vicryl suture.  Subcutaneous tissue was closed with continuous 2-0 Vicryl and the skin with a 3-0 Vicryl subcuticular closure.  The lower extremity vein harvest site was closed in layers in similar manner.  The sponge, needle, and instrument counts were correct according to scrub nurse.  Dry sterile dressings were applied over the incisions around chest tubes, which were hooked to Pleur-Evac suction.  The patient remained hemodynamically stable and transported to the SICU in guarded, but stable condition.     Evelene Croon, M.D.     BB/MEDQ  D:  03/01/2011  T:  03/02/2011  Job:  [id number]

## 2011-03-02 NOTE — Procedures (Signed)
Extubation Procedure Note  Patient Details:   Name: Joel Santiago DOB: 25-Dec-1940 MRN: [id number]     Evaluation  O2 sats: stable throughout Complications: No apparent complications Patient did tolerate procedure well. Bilateral Breath Sounds: Clear;Diminished Suctioning: Airway (RN sx. ) Yes  Pt extubated at this time and tolerated well. No complications noted. No stridor noted. Pt -40 on NIF and 0.7L on VC. Pt able to vocalize and breathe around deflated cuff. VS stable: HR 110, RR 19, BP 112/65, SPo2 97% on 2L [place]. RT will continue to monitor.   Loyal Jacobson [facility] 03/02/2011, 9:14 AM

## 2011-03-02 NOTE — Progress Notes (Signed)
Attempting to wake pt up and begin wean.  Pt not following commands, trying to get out of bed and pull out ETT.   BP 200/98,  HR 98 and sats down to 90.  I will sedate and try again later.

## 2011-03-02 NOTE — Progress Notes (Signed)
1 Day Post-Op Procedure(s) (LRB): CORONARY ARTERY BYPASS GRAFTING (CABG) (N/A) Subjective: Intubated but about ready for extubation  Objective: Vital signs in last 24 hours: Temp:  [96.4 F (35.8 C)-101.8 F (38.8 C)] 101.1 F (38.4 C) (02/06 0845) Pulse Rate:  [90-107] 101  (02/06 0845) Cardiac Rhythm:  [-] Normal sinus rhythm (02/06 0800) Resp:  [12-22] 16  (02/06 0845) BP: (82-158)/(52-81) 114/71 mmHg (02/06 0845) SpO2:  [95 %-100 %] 99 % (02/06 0845) Arterial Line BP: (94-171)/(52-83) 126/63 mmHg (02/06 0845) FiO2 (%):  [39.8 %-50.3 %] 40.4 % (02/06 0845) Weight:  [85 kg (187 lb 6.3 oz)-90.5 kg (199 lb 8.3 oz)] 90.5 kg (199 lb 8.3 oz) (02/06 0500)  Hemodynamic parameters for last 24 hours: PAP: (19-38)/(12-27) 30/19 mmHg CO:  [3.9 L/min-6.6 L/min] 6.6 L/min CI:  [1.9 L/min/m2-3.2 L/min/m2] 3.2 L/min/m2  Intake/Output from previous day: 02/05 0701 - 02/06 0700 In: 4095.3 [I.V.:3025.3; Blood:670; IV Piggyback:400] Out: 6110 [Urine:4500; Blood:1400; Chest Tube:210] Intake/Output this shift: Total I/O In: 110 [I.V.:60; IV Piggyback:50] Out: 60 [Urine:60]  General appearance:  slowed mentation Neurologic: good strength bilat Heart: regular rate and rhythm, S1, S2 normal, no murmur, click, rub or gallop Lungs: clear to auscultation bilaterally Extremities: edema mild Wound: dressing dry  Lab Results:  Basename 03/02/11 0355 03/01/11 2225  WBC 12.1* 10.9*  HGB 12.0* 11.4*  HCT 37.4* 35.2*  PLT 121* 110*   BMET:  Basename 03/02/11 0355 03/01/11 2205 02/27/11 0917  NA 137 139 --  K 4.4 3.7 --  CL 106 -- 101  CO2 24 -- 29  GLUCOSE 149* 116* --  BUN 7 -- 8  CREATININE 0.61 -- 0.65  CALCIUM 7.9* -- 9.2    PT/INR:  Basename 03/01/11 2225  LABPROT 17.4*  INR 1.40   ABG    Component Value Date/Time   PHART 7.384 03/02/2011 0403   HCO3 23.1 03/02/2011 0403   TCO2 24 03/02/2011 0403   ACIDBASEDEF 1.0 03/02/2011 0403   O2SAT 98.0 03/02/2011 0403   CBG (last 3)    Basename 03/02/11 0653 03/02/11 0502 03/02/11 0253  GLUCAP 137* 127* 73   CXR:  Clear  ECG:  NSR, diffuse ST elevation suggests pericardial inflammation Assessment/Plan: S/P Procedure(s) (LRB): CORONARY ARTERY BYPASS GRAFTING (CABG) (N/A) Extubate Mobilize Diuresis Diabetes control d/c tubes/lines See progression orders   LOS: 4 days    BARTLE,BRYAN K 03/02/2011

## 2011-03-03 ENCOUNTER — Inpatient Hospital Stay (HOSPITAL_COMMUNITY): Payer: Medicare Other

## 2011-03-03 LAB — GLUCOSE, CAPILLARY: Glucose-Capillary: 137 mg/dL — ABNORMAL HIGH (ref 70–99)

## 2011-03-03 LAB — BASIC METABOLIC PANEL
BUN: 12 mg/dL (ref 6–23)
Calcium: 8.5 mg/dL (ref 8.4–10.5)
Chloride: 98 mEq/L (ref 96–112)
Creatinine, Ser: 0.82 mg/dL (ref 0.50–1.35)
GFR calc Af Amer: >90 mL/min (ref >90)
GFR calc non Af Amer: 87 mL/min — ABNORMAL LOW (ref >90)

## 2011-03-03 LAB — CBC
HCT: 37.8 % — ABNORMAL LOW (ref 39.0–52.0)
MCHC: 32.3 g/dL (ref 30.0–36.0)
Platelets: 152 10*3/uL (ref 150–400)
RDW: 13.7 % (ref 11.5–15.5)

## 2011-03-03 MED ORDER — METFORMIN HCL 500 MG PO TABS
1000.0000 mg | ORAL_TABLET | Freq: Every day | ORAL | Status: DC
  Administered 2011-03-04 – 2011-03-08 (×5): 1000 mg via ORAL
  Filled 2011-03-03 (×6): qty 2

## 2011-03-03 MED ORDER — SODIUM CHLORIDE 0.9 % IJ SOLN: 3.0000 mL | INTRAMUSCULAR | Status: DC | PRN

## 2011-03-03 MED ORDER — POTASSIUM CHLORIDE 10 MEQ/50ML IV SOLN
10.0000 meq | INTRAVENOUS | Status: AC | PRN
  Administered 2011-03-03 (×3): 10 meq via INTRAVENOUS
  Filled 2011-03-03 (×3): qty 50

## 2011-03-03 MED ORDER — MOVING RIGHT ALONG BOOK
Freq: Once | Status: AC
  Administered 2011-03-03: 21:00:00
  Filled 2011-03-03 (×2): qty 1

## 2011-03-03 MED ORDER — DOCUSATE SODIUM 100 MG PO CAPS
200.0000 mg | ORAL_CAPSULE | Freq: Every day | ORAL | Status: DC
  Administered 2011-03-04 – 2011-03-05 (×2): 200 mg via ORAL
  Filled 2011-03-03 (×4): qty 2

## 2011-03-03 MED ORDER — BISACODYL 10 MG RE SUPP: 10.0000 mg | Freq: Every day | RECTAL | Status: DC | PRN

## 2011-03-03 MED ORDER — METOPROLOL TARTRATE 50 MG PO TABS
50.0000 mg | ORAL_TABLET | Freq: Two times a day (BID) | ORAL | Status: DC
  Filled 2011-03-03: qty 1

## 2011-03-03 MED ORDER — ACETAMINOPHEN 325 MG PO TABS
650.0000 mg | ORAL_TABLET | Freq: Four times a day (QID) | ORAL | Status: DC | PRN
  Administered 2011-03-06: 650 mg via ORAL
  Filled 2011-03-03 (×2): qty 2

## 2011-03-03 MED ORDER — METOPROLOL TARTRATE 50 MG PO TABS
50.0000 mg | ORAL_TABLET | Freq: Two times a day (BID) | ORAL | Status: DC
  Administered 2011-03-03 – 2011-03-04 (×2): 50 mg via ORAL
  Filled 2011-03-03 (×5): qty 1

## 2011-03-03 MED ORDER — BISACODYL 5 MG PO TBEC
10.0000 mg | DELAYED_RELEASE_TABLET | Freq: Every day | ORAL | Status: DC | PRN
  Filled 2011-03-03: qty 2

## 2011-03-03 MED ORDER — INSULIN ASPART 100 UNIT/ML ~~LOC~~ SOLN
0.0000 [IU] | Freq: Three times a day (TID) | SUBCUTANEOUS | Status: DC
  Administered 2011-03-03: 4 [IU] via SUBCUTANEOUS
  Administered 2011-03-04 (×3): 2 [IU] via SUBCUTANEOUS
  Administered 2011-03-05: 4 [IU] via SUBCUTANEOUS
  Administered 2011-03-05 – 2011-03-08 (×6): 2 [IU] via SUBCUTANEOUS
  Filled 2011-03-03: qty 3

## 2011-03-03 MED ORDER — SODIUM CHLORIDE 0.9 % IV SOLN: 250.0000 mL | INTRAVENOUS | Status: DC | PRN

## 2011-03-03 MED ORDER — PANTOPRAZOLE SODIUM 40 MG PO TBEC
40.0000 mg | DELAYED_RELEASE_TABLET | Freq: Every day | ORAL | Status: DC
  Administered 2011-03-04 – 2011-03-08 (×5): 40 mg via ORAL
  Filled 2011-03-03 (×5): qty 1

## 2011-03-03 MED ORDER — FUROSEMIDE 40 MG PO TABS
40.0000 mg | ORAL_TABLET | Freq: Every day | ORAL | Status: AC
  Administered 2011-03-04 – 2011-03-06 (×3): 40 mg via ORAL
  Filled 2011-03-03 (×3): qty 1

## 2011-03-03 MED ORDER — METOPROLOL TARTRATE 25 MG PO TABS
37.5000 mg | ORAL_TABLET | Freq: Once | ORAL | Status: AC
  Administered 2011-03-03: 37.5 mg via ORAL
  Filled 2011-03-03 (×2): qty 1

## 2011-03-03 MED ORDER — METOPROLOL TARTRATE 25 MG/10 ML ORAL SUSPENSION
50.0000 mg | Freq: Two times a day (BID) | ORAL | Status: DC
  Filled 2011-03-03: qty 20

## 2011-03-03 MED ORDER — POTASSIUM CHLORIDE CRYS ER 20 MEQ PO TBCR
40.0000 meq | EXTENDED_RELEASE_TABLET | Freq: Every day | ORAL | Status: AC
  Administered 2011-03-04 – 2011-03-06 (×3): 40 meq via ORAL
  Filled 2011-03-03 (×3): qty 2

## 2011-03-03 MED ORDER — SODIUM CHLORIDE 0.9 % IJ SOLN
3.0000 mL | Freq: Two times a day (BID) | INTRAMUSCULAR | Status: DC
  Administered 2011-03-03 – 2011-03-07 (×7): 3 mL via INTRAVENOUS

## 2011-03-03 MED ORDER — ONDANSETRON HCL 4 MG/2ML IJ SOLN: 4.0000 mg | Freq: Four times a day (QID) | INTRAMUSCULAR | Status: DC | PRN

## 2011-03-03 MED ORDER — TRAMADOL HCL 50 MG PO TABS
50.0000 mg | ORAL_TABLET | ORAL | Status: DC | PRN
  Administered 2011-03-04 (×2): 100 mg via ORAL
  Administered 2011-03-04: 50 mg via ORAL
  Administered 2011-03-04 – 2011-03-06 (×4): 100 mg via ORAL
  Filled 2011-03-03 (×2): qty 2
  Filled 2011-03-03: qty 1
  Filled 2011-03-03 (×4): qty 2

## 2011-03-03 MED ORDER — ONDANSETRON HCL 4 MG PO TABS
4.0000 mg | ORAL_TABLET | Freq: Four times a day (QID) | ORAL | Status: DC | PRN
  Administered 2011-03-04: 4 mg via ORAL
  Filled 2011-03-03: qty 1

## 2011-03-03 MED FILL — Heparin Sodium (Porcine) Inj 1000 Unit/ML: INTRAMUSCULAR | Qty: 10 | Status: AC

## 2011-03-03 MED FILL — Verapamil HCl IV Soln 2.5 MG/ML: INTRAVENOUS | Qty: 4 | Status: AC

## 2011-03-03 MED FILL — Lactated Ringer's Solution: INTRAVENOUS | Qty: 500 | Status: AC

## 2011-03-03 MED FILL — Potassium Chloride Inj 2 mEq/ML: INTRAVENOUS | Qty: 40 | Status: AC

## 2011-03-03 MED FILL — Nitroglycerin IV Soln 5 MG/ML: INTRAVENOUS | Qty: 10 | Status: AC

## 2011-03-03 MED FILL — Magnesium Sulfate Inj 50%: INTRAMUSCULAR | Qty: 10 | Status: AC

## 2011-03-03 NOTE — Plan of Care (Signed)
Problem: Phase III Progression Outcomes Goal: Transfer to PCTU/Telemetry POD Outcome: Completed/Met Date Met:  03/03/11 Transferred to 2016 @ 1645.  Tolerated transfer well.  Wife at bedside. Goal: Time patient transferred to PCTU/Telemetry POD Outcome: Completed/Met Date Met:  03/03/11 1645

## 2011-03-03 NOTE — Progress Notes (Signed)
SUBJECTIVE:  Sitting up in bed doing well  OBJECTIVE:   Vitals:   Filed Vitals:   03/03/11 0400 03/03/11 0500 03/03/11 0600 03/03/11 0700  BP: 116/72 113/66 122/70 142/73  Pulse: 110 103 102 115  Temp: 99.2 F (37.3 C)     TempSrc: Oral     Resp: 19 16 17 20   Height:      Weight:  90.1 kg (198 lb 10.2 oz)    SpO2: 96% 95% 96% 98%   I&O's:   Intake/Output Summary (Last 24 hours) at 03/03/11 0809 Last data filed at 03/03/11 1610  Gross per 24 hour  Intake   1229 ml  Output   3240 ml  Net  -2011 ml   TELEMETRY: Reviewed telemetry pt in sinus tachycardia     PHYSICAL EXAM General: Well developed, well nourished, in no acute distress Head: Eyes PERRLA, No xanthomas.   Normal cephalic and atramatic  Lungs:   Clear bilaterally to auscultation and percussion. Heart:   HRRR S1 S2 Pulses are 2+ & equal.            No carotid bruit. No JVD.  No abdominal bruits. No femoral bruits. Abdomen: Bowel sounds are positive, abdomen soft and non-tender without masses  LABS: Basic Metabolic Panel:  Basename 03/03/11 0408 03/02/11 1725 03/02/11 1720 03/02/11 0355  NA 135 138 -- --  K 3.7 3.8 -- --  CL 98 103 -- --  CO2 30 -- -- 24  GLUCOSE 142* 135* -- --  BUN 12 8 -- --  CREATININE 0.82 0.80 -- --  CALCIUM 8.5 -- -- 7.9*  MG -- -- 2.1 2.5  PHOS -- -- -- --   Liver Function Tests: No results found for this basename: AST:2,ALT:2,ALKPHOS:2,BILITOT:2,PROT:2,ALBUMIN:2 in the last 72 hours No results found for this basename: LIPASE:2,AMYLASE:2 in the last 72 hours CBC:  Basename 03/03/11 0408 03/02/11 1725 03/02/11 1720  WBC 15.9* -- 17.1*  NEUTROABS -- -- --  HGB 12.2* 14.6 --  HCT 37.8* 43.0 --  MCV 86.3 -- 86.3  PLT 152 -- 152    Lab Results  Component Value Date   INR 1.40 03/01/2011   INR 1.07 02/27/2011   INR 1.05 02/26/2011    RADIOLOGY: Dg Chest Portable 1 View In Am  03/02/2011  *RADIOLOGY REPORT*  Clinical Data: Status post CABG.  Shortness of breath.  PORTABLE CHEST  - 1 VIEW  Comparison: Chest 03/01/2011.  Findings: Support tubes and lines are unchanged.  No pneumothorax identified.  Bibasilar subsegmental atelectasis is more notable on the left.  Heart size is upper normal.  IMPRESSION: No interval change.  Original Report Authenticated By: Bernadene Bell. D'ALESSIO, M.D.   Dg Chest Portable 1 View  03/01/2011  *RADIOLOGY REPORT*  Clinical Data: Coronary bypass grafting  PORTABLE CHEST - 1 VIEW  Comparison: 02/26/2011  Findings: Changes of interval CABG.  Endotracheal tube tip 6.5 cm above carina.  Left chest tube directed laterally with no pneumothorax evident.  Right IJ Swan-Ganz catheter to the proximal right pulmonary artery.  Nasogastric tube extends into the stomach. Mediastinal drain is in place.  Lungs are clear.  No effusion. Heart size upper limits normal.  IMPRESSION:  1.  Changes of interval CABG with support hardware as above.  Original Report Authenticated By: Osa Craver, M.D.   Dg Abd Acute W/chest  02/26/2011  *RADIOLOGY REPORT*  Clinical Data: Generalized chest and abdominal pain.  ACUTE ABDOMEN SERIES (ABDOMEN 2 VIEW & CHEST 1 VIEW)  Comparison: None.  Findings: The heart size is at the upper limits of normal.  The lungs are clear.  Supine and upright views the abdomen demonstrates moderate stool throughout the colon.  No obstruction or free air is present.  Degenerative changes are present in the lower lumbar spine.  IMPRESSION:  1.  Borderline cardiomegaly without failure. 2.  Moderate stool throughout the colon. 3.  No acute abnormality of the chest or abdomen.  Original Report Authenticated By: Jamesetta Orleans. MATTERN, M.D.      ASSESSMENT:  1. S/p NSTEMI  2. Severe 3 vessel ASCAD s/p CABG post op day #2 doing well  3. DM  4. HTN  5.  Post-op PAF  PLAN: 1.  Consider increasing beta blocker for better rate control and suppression of PAF       Quintella Reichert, MD  03/03/2011  8:09 AM

## 2011-03-03 NOTE — Progress Notes (Signed)
2 Days Post-Op Procedure(s) (LRB): CORONARY ARTERY BYPASS GRAFTING (CABG) (N/A) Subjective: No complaints  Objective: Vital signs in last 24 hours: Temp:  [98.6 F (37 C)-101 F (38.3 C)] 98.6 F (37 C) (02/07 0810) Pulse Rate:  [102-130] 120  (02/07 0921) Cardiac Rhythm:  [-] Sinus tachycardia (02/07 0900) Resp:  [15-25] 20  (02/07 0700) BP: (112-153)/(62-108) 128/67 mmHg (02/07 0921) SpO2:  [92 %-98 %] 98 % (02/07 0700) Weight:  [90.1 kg (198 lb 10.2 oz)] 90.1 kg (198 lb 10.2 oz) (02/07 0500)  Hemodynamic parameters for last 24 hours:    Intake/Output from previous day: 02/06 0701 - 02/07 0700 In: 1339 [P.O.:665; I.V.:470; IV Piggyback:204] Out: 3300 [Urine:3225; Chest Tube:75] Intake/Output this shift:    General appearance: alert and cooperative Neurologic: intact Heart: regular rate and rhythm, S1, S2 normal, no murmur, click, rub or gallop Lungs: clear to auscultation bilaterally Extremities: extremities normal, atraumatic, no cyanosis or edema Wound: incision ok  Lab Results:  Basename 03/03/11 0408 03/02/11 1725 03/02/11 1720  WBC 15.9* -- 17.1*  HGB 12.2* 14.6 --  HCT 37.8* 43.0 --  PLT 152 -- 152   BMET:  Basename 03/03/11 0408 03/02/11 1725 03/02/11 0355  NA 135 138 --  K 3.7 3.8 --  CL 98 103 --  CO2 30 -- 24  GLUCOSE 142* 135* --  BUN 12 8 --  CREATININE 0.82 0.80 --  CALCIUM 8.5 -- 7.9*    PT/INR:  Basename 03/01/11 2225  LABPROT 17.4*  INR 1.40   ABG    Component Value Date/Time   PHART 7.368 03/02/2011 0850   HCO3 23.6 03/02/2011 0850   TCO2 26 03/02/2011 1725   ACIDBASEDEF 1.0 03/02/2011 0850   O2SAT 98.0 03/02/2011 0850   CBG (last 3)   Basename 03/03/11 0805 03/03/11 0406 03/02/11 2355  GLUCAP 116* 133* 137*   CXR:   Clear  Assessment/Plan: S/P Procedure(s) (LRB): CORONARY ARTERY BYPASS GRAFTING (CABG) (N/A) Mobilize Diuresis Diabetes control Plan for transfer to step-down: see transfer orders   LOS: 5 days    Blayde Bacigalupi  K 03/03/2011

## 2011-03-03 NOTE — Progress Notes (Signed)
UR Completed.  Pennye Beeghly Jane [phone] 03/03/2011  

## 2011-03-04 LAB — BASIC METABOLIC PANEL
BUN: 12 mg/dL (ref 6–23)
CO2: 29 mEq/L (ref 19–32)
Calcium: 8.8 mg/dL (ref 8.4–10.5)
Chloride: 99 mEq/L (ref 96–112)
Creatinine, Ser: 0.76 mg/dL (ref 0.50–1.35)

## 2011-03-04 LAB — CBC
HCT: 36.9 % — ABNORMAL LOW (ref 39.0–52.0)
MCH: 28 pg (ref 26.0–34.0)
MCV: 86 fL (ref 78.0–100.0)
RBC: 4.29 MIL/uL (ref 4.22–5.81)
RDW: 13.8 % (ref 11.5–15.5)
WBC: 10.5 10*3/uL (ref 4.0–10.5)

## 2011-03-04 LAB — GLUCOSE, CAPILLARY: Glucose-Capillary: 139 mg/dL — ABNORMAL HIGH (ref 70–99)

## 2011-03-04 MED ORDER — CHLORPROMAZINE HCL 25 MG PO TABS
25.0000 mg | ORAL_TABLET | Freq: Three times a day (TID) | ORAL | Status: DC | PRN
  Administered 2011-03-04 – 2011-03-05 (×4): 25 mg via ORAL
  Filled 2011-03-04 (×4): qty 1

## 2011-03-04 NOTE — Progress Notes (Signed)
Pt had an incontient episode in the bed due to unable to ambulate in time. Son requested condom cath be replaced for the night.  A condom cath was applied. Pt tolerated well and without agitation. Will continue to monitor

## 2011-03-04 NOTE — Evaluation (Signed)
Physical Therapy Evaluation Patient Details Name: Joel Santiago MRN: [id number] DOB: 18-May-1940 Today's Date: 03/04/2011  Problem List:  Patient Active Problem List  Diagnoses  . Chest pain    Past Medical History:  Past Medical History  Diagnosis Date  . Diabetes mellitus   . Hypertension   . Stroke   . Angina    Past Surgical History:  Past Surgical History  Procedure Date  . Coronary artery bypass graft 03/01/2011    Procedure: CORONARY ARTERY BYPASS GRAFTING (CABG);  Surgeon: Alleen Borne, MD;  Location: [facility] OR;  Service: Open Heart Surgery;  Laterality: N/A;  Coronary artery bypass graft times five on pump using left internal mammary artery and right greater saphenous vein via endovein harvest.    PT Assessment/Plan/Recommendation PT Assessment Clinical Impression Statement: Pt s/p CABG now with AMS. Pt clearly limited by cognition and progression will greatly depend on mental clarity. Physically performing well but will benefit from therapy to maximize mobility, safety awareness and independence prior to discharge. Throughout session pt perseverating on calling family rather than attending to task. Pt also unaware of condom cath. PT Recommendation/Assessment: Patient will need skilled PT in the acute care venue PT Problem List: Decreased activity tolerance;Decreased balance;Decreased mobility;Decreased knowledge of use of DME;Decreased safety awareness;Decreased knowledge of precautions;Decreased cognition Barriers to Discharge: None PT Therapy Diagnosis : Abnormality of gait;Difficulty walking;Altered mental status PT Plan PT Frequency: Min 3X/week PT Treatment/Interventions: Gait training;DME instruction;Stair training;Functional mobility training;Therapeutic activities;Balance training;Patient/family education;Therapeutic exercise;Cognitive remediation PT Recommendation Recommendations for Other Services: OT consult Follow Up Recommendations: Home health PT & Supervision  for mobility/OOB vs Skilled nursing facility (If unable to meet goals or family unable to assist then SNF) Equipment Recommended: Rolling walker with 5" wheels PT Goals  Acute Rehab PT Goals PT Goal Formulation: With patient Time For Goal Achievement: 2 weeks Pt will go Supine/Side to Sit: with supervision;with HOB 0 degrees PT Goal: Supine/Side to Sit - Progress: Goal set today Pt will go Sit to Supine/Side: with supervision;with HOB 0 degrees PT Goal: Sit to Supine/Side - Progress: Goal set today Pt will go Sit to Stand: with supervision PT Goal: Sit to Stand - Progress: Goal set today Pt will go Stand to Sit: with supervision PT Goal: Stand to Sit - Progress: Goal set today Pt will Ambulate: >150 feet;with supervision;with least restrictive assistive device PT Goal: Ambulate - Progress: Goal set today Additional Goals Additional Goal #1: Pt will independently state and demonstrate adherence to 3/3 precautions PT Goal: Additional Goal #1 - Progress: Goal set today  PT Evaluation Precautions/Restrictions  Precautions Precautions: Sternal;Fall Restrictions Weight Bearing Restrictions: No Prior Functioning  Home Living Lives With: Spouse Type of Home: Apartment Home Layout: One level Home Access: Level entry Bathroom Shower/Tub: Tub/shower unit Home Adaptive Equipment: None Prior Function Level of Independence: Independent with basic ADLs;Independent with transfers;Independent with homemaking with ambulation;Independent with gait Driving: Yes Vocation: Retired Producer, television/film/video: Awake/alert Overall Cognitive Status: Impaired Attention: Impaired Current Attention Level: Sustained Memory: Appears impaired Memory Deficits: pt unable to state birthdate and phone number or surgery performed Orientation Level: Oriented to person;Oriented to place;Disoriented to situation;Disoriented to time (with person only oriented to name) Safety/Judgement: Decreased  safety judgement for tasks assessed;Decreased awareness of safety precautions Decreased Safety/Judgement: Decreased awareness of need for assistance Sensation/Coordination Sensation Light Touch: Appears Intact Extremity Assessment RLE Assessment RLE Assessment: Within Functional Limits LLE Assessment LLE Assessment: Within Functional Limits Mobility (including Balance) Bed Mobility Bed Mobility: Yes Rolling Left: 4: Min  assist Rolling Left Details (indicate cue type and reason): cueing for safety and sequence Left Sidelying to Sit: 4: Min assist;HOB flat Left Sidelying to Sit Details (indicate cue type and reason): assist to elevate trunk and to bring bilLE off bed unable to perform with cueing alone Sitting - Scoot to Edge of Bed: 5: Supervision Sitting - Scoot to Edge of Bed Details (indicate cue type and reason): cues for safety and reciprocal scooting Transfers Transfers: Yes Sit to Stand: 4: Min assist;3: Mod assist;From chair/3-in-1;From bed Sit to Stand Details (indicate cue type and reason): min from bed, mod from chair due to lower surface. Cueing throughout for sequence and hands on thighs for precautions Stand to Sit: 4: Min assist;To chair/3-in-1 Stand to Sit Details: cueing for sequence and assist to control descent Ambulation/Gait Ambulation/Gait: Yes Ambulation/Gait Assistance: 4: Min assist Ambulation/Gait Assistance Details (indicate cue type and reason): pt required constant assist to faciliation anterior tranlation/progression and to steer RW Ambulation Distance (Feet): 150 Feet Assistive device: Rolling walker Gait Pattern: Shuffle;Decreased stride length Stairs: No  Posture/Postural Control Posture/Postural Control: No significant limitations Balance Balance Assessed: Yes Static Standing Balance Static Standing - Level of Assistance: 4: Min assist Exercise    End of Session PT - End of Session Equipment Utilized During Treatment: Gait belt Activity  Tolerance: Patient tolerated treatment well Patient left: in chair;with call bell in reach;Other (comment) (sitter present) Nurse Communication: Mobility status for transfers;Mobility status for ambulation General Behavior During Session: [facility] for tasks performed Cognition: Impaired  Delorse Lek 03/04/2011, 9:15 AM  Toney Sang, PT [phone]

## 2011-03-04 NOTE — Progress Notes (Addendum)
[address]            [place] [address]          [phone]     3 Days Post-Op  Procedure(s) (LRB): CORONARY ARTERY BYPASS GRAFTING (CABG) (N/A) Subjective: Fairly constant hiccups, slow improvement with cardiac rehab  Objective  Telemetry Stach,SR  Temp:  [98.3 F (36.8 C)-102.3 F (39.1 C)] 98.3 F (36.8 C) (02/08 0634) Pulse Rate:  [101-123] 114  (02/08 1028) Resp:  [18-20] 20  (02/08 0634) BP: (113-160)/(59-92) 114/59 mmHg (02/08 1028) SpO2:  [92 %-100 %] 93 % (02/08 0634) Weight:  [180 lb 4.8 oz (81.784 kg)] 180 lb 4.8 oz (81.784 kg) (02/08 0634)   Intake/Output Summary (Last 24 hours) at 03/04/11 1323 Last data filed at 03/04/11 1039  Gross per 24 hour  Intake    646 ml  Output   1255 ml  Net   -609 ml       General appearance: alert and no distress Heart: regular rate and rhythm and S1, S2 normal Lungs: mildly diminished in bases Abdomen: mod distension, + BS, non tender Extremities: no edema Wound: incisins healing well   Lab Results:  Basename 03/04/11 0530 03/03/11 0408 03/02/11 1720 03/02/11 0355  NA 137 135 -- --  K 3.8 3.7 -- --  CL 99 98 -- --  CO2 29 30 -- --  GLUCOSE 122* 142* -- --  BUN 12 12 -- --  CREATININE 0.76 0.82 -- --  CALCIUM 8.8 8.5 -- --  MG -- -- 2.1 2.5  PHOS -- -- -- --   No results found for this basename: AST:2,ALT:2,ALKPHOS:2,BILITOT:2,PROT:2,ALBUMIN:2 in the last 72 hours No results found for this basename: LIPASE:2,AMYLASE:2 in the last 72 hours  Basename 03/04/11 0530 03/03/11 0408  WBC 10.5 15.9*  NEUTROABS -- --  HGB 12.0* 12.2*  HCT 36.9* 37.8*  MCV 86.0 86.3  PLT 172 152   No results found for this basename: CKTOTAL:4,CKMB:4,TROPONINI:4 in the last 72 hours No components found with this basename: POCBNP:3 No results found for this basename: DDIMER in the last 72 hours No results found for this basename: HGBA1C in the last 72 hours No results found for this basename:  CHOL,HDL,LDLCALC,TRIG,CHOLHDL in the last 72 hours No results found for this basename: TSH,T4TOTAL,FREET3,T3FREE,THYROIDAB in the last 72 hours No results found for this basename: VITAMINB12,FOLATE,FERRITIN,TIBC,IRON,RETICCTPCT in the last 72 hours  Medications: Scheduled    . docusate sodium  200 mg Oral Daily  . furosemide  40 mg Oral Daily  . insulin aspart  0-24 Units Subcutaneous TID AC & HS  . metFORMIN  1,000 mg Oral Q breakfast  . metoprolol tartrate  50 mg Oral BID  . moving right along book   Does not apply Once  . pantoprazole  40 mg Oral QAC breakfast  . potassium chloride  40 mEq Oral Daily  . sodium chloride  3 mL Intravenous Q12H  . DISCONTD: acetaminophen (TYLENOL) oral liquid 160 mg/5 mL  975 mg Per Tube Q6H  . DISCONTD: acetaminophen  1,000 mg Oral Q6H  . DISCONTD: aspirin  324 mg Per Tube Daily  . DISCONTD: aspirin EC  325 mg Oral Daily  . DISCONTD: bisacodyl  10 mg Oral Daily  . DISCONTD: bisacodyl  10 mg Rectal Daily  . DISCONTD: cefUROXime (ZINACEF)  IV  1.5 g Intravenous Q12H  . DISCONTD: docusate sodium  200 mg Oral Daily  . DISCONTD: furosemide  40 mg Intravenous BID  . DISCONTD: insulin aspart  0-24 Units Subcutaneous Q4H  . DISCONTD: insulin glargine  15 Units Subcutaneous Daily  . DISCONTD: metoprolol tartrate  50 mg Oral BID  . DISCONTD: metoprolol tartrate  50 mg Per Tube BID  . DISCONTD: pantoprazole  40 mg Oral Q1200  . DISCONTD: sodium chloride  3 mL Intravenous Q12H     Radiology/Studies:  Dg Chest Portable 1 View In Am  03/03/2011  *RADIOLOGY REPORT*  Clinical Data: Post CABG  PORTABLE CHEST - 1 VIEW  Comparison: Portable exam 0623 hours compared to 03/02/2011  Findings: Mild enlargement of cardiac silhouette post median sternotomy. Interval removal of endotracheal tube, nasogastric tube, mediastinal drain, Swan-Ganz catheter and left thoracostomy tube. Right jugular line remains, tip projecting over proximal SVC. Minimal pulmonary vascular  congestion. Bibasilar atelectasis. No infiltrate, significant pleural effusion, or pneumothorax. Bones unremarkable.  IMPRESSION: Bibasilar atelectasis.  Original Report Authenticated By: Lollie Marrow, M.D.    INR: Will add last result for INR, ABG once components are confirmed Will add last 4 CBG results once components are confirmed  Assessment/Plan: S/P Procedure(s) (LRB): CORONARY ARTERY BYPASS GRAFTING (CABG) (N/A)  1. Push rehab as able 2. Try thorazine for hiccups 3. Add MOM for constipation 4. cbg adeq controlled on glucophage  LOS: 6 days    GOLD,WAYNE E 2/8/20131:23 PM     Chart reviewed, patient examined, agree with above.

## 2011-03-04 NOTE — Progress Notes (Signed)
Pt began to grunt but did not appear agitated until I tried to give him pain medicine. He began to holler and refused to take the medicine. The son had to coax him for 5 minutes to take it at 2120.  Reassessed pt at 2210 and pt is still agitated and refused to take his bedtime medicine. The son was able to convince him to take his Thorazine. Pt currently resting. Pt is currently asleep. Will continue to monitor

## 2011-03-04 NOTE — Progress Notes (Signed)
CARDIAC REHAB PHASE I   PRE:  Rate/Rhythm: 102ST  BP:  Supine: 106/62  Sitting:   Standing:    SaO2: 95%RA  MODE:  Ambulation: bed to sink ft   POST:  Rate/Rhythem:  BP:  Supine:   Sitting:   Standing:    SaO2:  Incontinent of urine around condom when standing at sink 1310-1340 Pt walked from bed to sink with rolling walker and asst x 2. Pt had difficulty moving feet. Taking baby steps barely moving feet. Encouraged larger steps without success. Pt then said "here it comes". Voided around condom. Helped clean pt up and assisted to bed. Pt grunting and grimacing. Hard to get answer from pt what is wrong.  Family in room.  Duanne Limerick

## 2011-03-04 NOTE — Progress Notes (Signed)
SUBJECTIVE:  Doing better.  No further PAF  OBJECTIVE:   Vitals:   Filed Vitals:   03/03/11 2200 03/04/11 0634 03/04/11 0842 03/04/11 0903  BP:  130/81    Pulse:  101 108 123  Temp: 98.8 F (37.1 C) 98.3 F (36.8 C)    TempSrc:  Oral    Resp:  20    Height:      Weight:  81.784 kg (180 lb 4.8 oz)    SpO2:  93%     I&O's:   Intake/Output Summary (Last 24 hours) at 03/04/11 1610 Last data filed at 03/04/11 0900  Gross per 24 hour  Intake   1190 ml  Output   1495 ml  Net   -305 ml   TELEMETRY: Reviewed telemetry pt in NSR with PVC's     PHYSICAL EXAM General: Well developed, well nourished, in no acute distress Head: Eyes PERRLA, No xanthomas.   Normal cephalic and atramatic  Lungs:   Clear bilaterally to auscultation and percussion. Heart:   HRRR S1 S2 Pulses are 2+ & equal.            No carotid bruit. No JVD.  No abdominal bruits. No femoral bruits. Abdomen: Bowel sounds are positive, abdomen soft and non-tender without masses  Extremities:   No clubbing, cyanosis or edema.  DP +1 Neuro: Alert and oriented X 3. Psych:  Good affect, responds appropriately   LABS: Basic Metabolic Panel:  Basename 03/04/11 0530 03/03/11 0408 03/02/11 1720 03/02/11 0355  NA 137 135 -- --  K 3.8 3.7 -- --  CL 99 98 -- --  CO2 29 30 -- --  GLUCOSE 122* 142* -- --  BUN 12 12 -- --  CREATININE 0.76 0.82 -- --  CALCIUM 8.8 8.5 -- --  MG -- -- 2.1 2.5  PHOS -- -- -- --   CBC:  Basename 03/04/11 0530 03/03/11 0408  WBC 10.5 15.9*  NEUTROABS -- --  HGB 12.0* 12.2*  HCT 36.9* 37.8*  MCV 86.0 86.3  PLT 172 152   Coag Panel:   Lab Results  Component Value Date   INR 1.40 03/01/2011   INR 1.07 02/27/2011   INR 1.05 02/26/2011    RADIOLOGY: Dg Chest Portable 1 View In Am  03/03/2011  *RADIOLOGY REPORT*  Clinical Data: Post CABG  PORTABLE CHEST - 1 VIEW  Comparison: Portable exam 0623 hours compared to 03/02/2011  Findings: Mild enlargement of cardiac silhouette post median  sternotomy. Interval removal of endotracheal tube, nasogastric tube, mediastinal drain, Swan-Ganz catheter and left thoracostomy tube. Right jugular line remains, tip projecting over proximal SVC. Minimal pulmonary vascular congestion. Bibasilar atelectasis. No infiltrate, significant pleural effusion, or pneumothorax. Bones unremarkable.  IMPRESSION: Bibasilar atelectasis.  Original Report Authenticated By: Lollie Marrow, M.D.   Dg Chest Portable 1 View In Am  03/02/2011  *RADIOLOGY REPORT*  Clinical Data: Status post CABG.  Shortness of breath.  PORTABLE CHEST - 1 VIEW  Comparison: Chest 03/01/2011.  Findings: Support tubes and lines are unchanged.  No pneumothorax identified.  Bibasilar subsegmental atelectasis is more notable on the left.  Heart size is upper normal.  IMPRESSION: No interval change.  Original Report Authenticated By: Bernadene Bell. D'ALESSIO, M.D.   Dg Chest Portable 1 View  03/01/2011  *RADIOLOGY REPORT*  Clinical Data: Coronary bypass grafting  PORTABLE CHEST - 1 VIEW  Comparison: 02/26/2011  Findings: Changes of interval CABG.  Endotracheal tube tip 6.5 cm above carina.  Left chest tube directed  laterally with no pneumothorax evident.  Right IJ Swan-Ganz catheter to the proximal right pulmonary artery.  Nasogastric tube extends into the stomach. Mediastinal drain is in place.  Lungs are clear.  No effusion. Heart size upper limits normal.  IMPRESSION:  1.  Changes of interval CABG with support hardware as above.  Original Report Authenticated By: Osa Craver, M.D.   Dg Abd Acute W/chest  02/26/2011  *RADIOLOGY REPORT*  Clinical Data: Generalized chest and abdominal pain.  ACUTE ABDOMEN SERIES (ABDOMEN 2 VIEW & CHEST 1 VIEW)  Comparison: None.  Findings: The heart size is at the upper limits of normal.  The lungs are clear.  Supine and upright views the abdomen demonstrates moderate stool throughout the colon.  No obstruction or free air is present.  Degenerative changes are present  in the lower lumbar spine.  IMPRESSION:  1.  Borderline cardiomegaly without failure. 2.  Moderate stool throughout the colon. 3.  No acute abnormality of the chest or abdomen.  Original Report Authenticated By: Jamesetta Orleans. MATTERN, M.D.      ASSESSMENT:  1. S/p NSTEMI  2. Severe 3 vessel ASCAD s/p CABG post op day #2 doing well  3. DM  4. HTN  5. Post-op PAF with no reoccurence   PLAN:   Continue medical therapy with beta blocker for PAF suppression  Quintella Reichert, MD  03/04/2011  9:39 AM

## 2011-03-05 LAB — GLUCOSE, CAPILLARY
Glucose-Capillary: 138 mg/dL — ABNORMAL HIGH (ref 70–99)
Glucose-Capillary: 135 mg/dL — ABNORMAL HIGH (ref 70–99)

## 2011-03-05 MED ORDER — ASPIRIN 325 MG PO TABS
325.0000 mg | ORAL_TABLET | Freq: Every day | ORAL | Status: DC
  Administered 2011-03-05 – 2011-03-08 (×4): 325 mg via ORAL
  Filled 2011-03-05 (×4): qty 1

## 2011-03-05 MED ORDER — METOPROLOL TARTRATE 50 MG PO TABS
75.0000 mg | ORAL_TABLET | Freq: Two times a day (BID) | ORAL | Status: DC
  Administered 2011-03-05 – 2011-03-08 (×6): 75 mg via ORAL
  Filled 2011-03-05 (×7): qty 1

## 2011-03-05 MED ORDER — LACTULOSE 10 GM/15ML PO SOLN
20.0000 g | Freq: Every day | ORAL | Status: DC | PRN
  Filled 2011-03-05: qty 30

## 2011-03-05 NOTE — Progress Notes (Signed)
Pt ambulated 150 ft with walker and one assist. Tolerated well. Will continue to monitor. Yarel Rushlow, Chrystine Oiler

## 2011-03-05 NOTE — Progress Notes (Signed)
Pt ambulated in hallway 250 feet with rolling walker and assist x1. Gait slow and careful. Pt tolerated well.  Alfonso Ellis, RN

## 2011-03-05 NOTE — Progress Notes (Signed)
Subjective:  71 year old post CABG 03/01/11 x5 using a left internal mammary  artery graft to the left anterior descending coronary artery with a  saphenous vein graft to diagonal branch of the LAD, a saphenous vein  graft to the obtuse marginal branch of the left circumflex coronary  artery, and a sequential saphenous vein graft to the posterior  descending and posterolateral branches of the right coronary artery.  No CP, no SOB. TELE with Sinus tachycardia 123 (no flutter wave)  Objective:  Vital Signs in the last 24 hours: Temp:  [97.7 F (36.5 C)-99 F (37.2 C)] 99 F (37.2 C) (02/09 0519) Pulse Rate:  [96-140] 96  (02/09 0646) Resp:  [18-19] 19  (02/09 0519) BP: (113-128)/(59-85) 128/85 mmHg (02/09 0519) SpO2:  [91 %-95 %] 91 % (02/09 0519) Weight:  [82.373 kg (181 lb 9.6 oz)] 82.373 kg (181 lb 9.6 oz) (02/09 0519)  Intake/Output from previous day: 02/08 0701 - 02/09 0700 In: 683 [P.O.:680; I.V.:3] Out: 725 [Urine:725]   Physical Exam: General: Well developed, well nourished, in no acute distress. Head:  Normocephalic and atraumatic. Lungs: Clear to auscultation and percussion. Heart: Tachy reg, no rub, no m Abdomen: soft, non-tender, positive bowel sounds. Extremities: No clubbing or cyanosis. No edema. Neurologic: Alert and oriented x 3.    Lab Results:  Basename 03/04/11 0530 03/03/11 0408  WBC 10.5 15.9*  HGB 12.0* 12.2*  PLT 172 152    Basename 03/04/11 0530 03/03/11 0408  NA 137 135  K 3.8 3.7  CL 99 98  CO2 29 30  GLUCOSE 122* 142*  BUN 12 12  CREATININE 0.76 0.82   Telemetry: Sinus tach 123 Personally viewed.   Assessment/Plan:   CAD - post CABG, progressing Tachycardia - appears sinus tach. Appears comfortable. No SOB, no CP. Tmax 99. On metop 50 bid, may wish to increase to 75 bid. Discussed with Gershon Crane.  Last ECG with diffuse ST elevation (possible pericarditis) with HR 98. ASA 325 added to orders.    Perian Tedder 03/05/2011, 8:45  AM

## 2011-03-05 NOTE — Progress Notes (Addendum)
[address]            [place] [address]          [phone]     4 Days Post-Op  Procedure(s) (LRB): CORONARY ARTERY BYPASS GRAFTING (CABG) (N/A) Subjective: No new c/o, family reports he's more oriented today  Objective  Telemetry Sinus tachy  Temp:  [97.7 F (36.5 C)-99 F (37.2 C)] 99 F (37.2 C) (02/09 0519) Pulse Rate:  [96-140] 96  (02/09 0646) Resp:  [18-19] 19  (02/09 0519) BP: (113-128)/(74-85) 128/85 mmHg (02/09 0519) SpO2:  [91 %-95 %] 91 % (02/09 0519) Weight:  [181 lb 9.6 oz (82.373 kg)] 181 lb 9.6 oz (82.373 kg) (02/09 0519)   Intake/Output Summary (Last 24 hours) at 03/05/11 1028 Last data filed at 03/05/11 0900  Gross per 24 hour  Intake    803 ml  Output    475 ml  Net    328 ml       General appearance: alert, cooperative and no distress Heart: regular rate and rhythm, S1, S2 normal and tachy Lungs: diminished in bases Abdomen: moderate distension, + BS, non tender Extremities: no edema Wound: incisions healing well  Lab Results:  Basename 03/04/11 0530 03/03/11 0408 03/02/11 1720  NA 137 135 --  K 3.8 3.7 --  CL 99 98 --  CO2 29 30 --  GLUCOSE 122* 142* --  BUN 12 12 --  CREATININE 0.76 0.82 --  CALCIUM 8.8 8.5 --  MG -- -- 2.1  PHOS -- -- --   No results found for this basename: AST:2,ALT:2,ALKPHOS:2,BILITOT:2,PROT:2,ALBUMIN:2 in the last 72 hours No results found for this basename: LIPASE:2,AMYLASE:2 in the last 72 hours  Basename 03/04/11 0530 03/03/11 0408  WBC 10.5 15.9*  NEUTROABS -- --  HGB 12.0* 12.2*  HCT 36.9* 37.8*  MCV 86.0 86.3  PLT 172 152   No results found for this basename: CKTOTAL:4,CKMB:4,TROPONINI:4 in the last 72 hours No components found with this basename: POCBNP:3 No results found for this basename: DDIMER in the last 72 hours No results found for this basename: HGBA1C in the last 72 hours No results found for this basename: CHOL,HDL,LDLCALC,TRIG,CHOLHDL in the last  72 hours No results found for this basename: TSH,T4TOTAL,FREET3,T3FREE,THYROIDAB in the last 72 hours No results found for this basename: VITAMINB12,FOLATE,FERRITIN,TIBC,IRON,RETICCTPCT in the last 72 hours  Medications: Scheduled    . aspirin  325 mg Oral Daily  . docusate sodium  200 mg Oral Daily  . furosemide  40 mg Oral Daily  . insulin aspart  0-24 Units Subcutaneous TID AC & HS  . metFORMIN  1,000 mg Oral Q breakfast  . metoprolol tartrate  50 mg Oral BID  . pantoprazole  40 mg Oral QAC breakfast  . potassium chloride  40 mEq Oral Daily  . sodium chloride  3 mL Intravenous Q12H     Radiology/Studies:  No results found.  INR: Will add last result for INR, ABG once components are confirmed Will add last 4 CBG results once components are confirmed  Assessment/Plan: S/P Procedure(s) (LRB): CORONARY ARTERY BYPASS GRAFTING (CABG) (N/A)  1.Sinus Tach, will increase B Blocker 2. cbg well controlled 3. Push rehab, pulm toilet 4. Add lactulose    LOS: 7 days    GOLD,WAYNE E 2/9/201310:28 AM    patient examined and medical record reviewed,agree with above note. VAN TRIGT III,PETER 03/05/2011

## 2011-03-05 NOTE — Progress Notes (Signed)
Pt awoke at 0430 and has been very pleasant since. Denies and pain. Pt ambulated 50 ft with walker. Pt performed with a shuffled gait with baby steps. Heart rate increase up to 140's during ambulation, slowly lowered to the 80s after resting in bed. Bed alarm on. Told pt to call. Call light within reach. Will continue to monitor

## 2011-03-06 LAB — GLUCOSE, CAPILLARY
Glucose-Capillary: 153 mg/dL — ABNORMAL HIGH (ref 70–99)
Glucose-Capillary: 118 mg/dL — ABNORMAL HIGH (ref 70–99)
Glucose-Capillary: 147 mg/dL — ABNORMAL HIGH (ref 70–99)

## 2011-03-06 NOTE — Progress Notes (Signed)
                   [address]            [place] [address]          [phone]     5 Days Post-Op  Procedure(s) (LRB): CORONARY ARTERY BYPASS GRAFTING (CABG) (N/A) Subjective: Having loose stools, otherwise feels well and delerium continues to resolve  Objective  Telemetry Stach at 100 , improved from 120's  Temp:  [98 F (36.7 C)-98.9 F (37.2 C)] 98.6 F (37 C) (02/10 0416) Pulse Rate:  [101-129] 101  (02/10 0416) Resp:  [18-19] 19  (02/10 0416) BP: (117-138)/(73-78) 117/73 mmHg (02/10 0416) SpO2:  [92 %-94 %] 94 % (02/10 0416) Weight:  [179 lb 1.6 oz (81.239 kg)] 179 lb 1.6 oz (81.239 kg) (02/10 0416)   Intake/Output Summary (Last 24 hours) at 03/06/11 1043 Last data filed at 03/06/11 1000  Gross per 24 hour  Intake    480 ml  Output   2277 ml  Net  -1797 ml       General appearance: alert, cooperative and no distress Heart: regular rate and rhythm Lungs: sl diminished in bases Abdomen: less distended , non tender, + BS Extremities: no edema Wound: incisions healing well  Lab Results:  Basename 03/04/11 0530  NA 137  K 3.8  CL 99  CO2 29  GLUCOSE 122*  BUN 12  CREATININE 0.76  CALCIUM 8.8  MG --  PHOS --   No results found for this basename: AST:2,ALT:2,ALKPHOS:2,BILITOT:2,PROT:2,ALBUMIN:2 in the last 72 hours No results found for this basename: LIPASE:2,AMYLASE:2 in the last 72 hours  Basename 03/04/11 0530  WBC 10.5  NEUTROABS --  HGB 12.0*  HCT 36.9*  MCV 86.0  PLT 172   No results found for this basename: CKTOTAL:4,CKMB:4,TROPONINI:4 in the last 72 hours No components found with this basename: POCBNP:3 No results found for this basename: DDIMER in the last 72 hours No results found for this basename: HGBA1C in the last 72 hours No results found for this basename: CHOL,HDL,LDLCALC,TRIG,CHOLHDL in the last 72 hours No results found for this basename: TSH,T4TOTAL,FREET3,T3FREE,THYROIDAB in the last 72 hours No results  found for this basename: VITAMINB12,FOLATE,FERRITIN,TIBC,IRON,RETICCTPCT in the last 72 hours  Medications: Scheduled    . aspirin  325 mg Oral Daily  . docusate sodium  200 mg Oral Daily  . furosemide  40 mg Oral Daily  . insulin aspart  0-24 Units Subcutaneous TID AC & HS  . metFORMIN  1,000 mg Oral Q breakfast  . metoprolol tartrate  75 mg Oral BID  . pantoprazole  40 mg Oral QAC breakfast  . potassium chloride  40 mEq Oral Daily  . sodium chloride  3 mL Intravenous Q12H  . DISCONTD: metoprolol tartrate  50 mg Oral BID     Radiology/Studies:  No results found.  INR: Will add last result for INR, ABG once components are confirmed Will add last 4 CBG results once components are confirmed  Assessment/Plan: S/P Procedure(s) (LRB): CORONARY ARTERY BYPASS GRAFTING (CABG) (N/A)  1. Good overall progress, will leave at current beta blocker dose 2.  D/c laxatives 3. cbg ok 4. Push rehab/ pulm toilet 5. D/c pacing wires 6. Patient and family hoping for Tuesday discharge   LOS: 8 days    Breniyah Romm E 2/10/201310:43 AM

## 2011-03-06 NOTE — Progress Notes (Signed)
Subjective:  70 post CABG, sinus tachycardia  -Feeling well. No CP, no SOB. Heart rate improved from 123-100.   Objective:  Vital Signs in the last 24 hours: Temp:  [98 F (36.7 C)-98.9 F (37.2 C)] 98.6 F (37 C) (02/10 0416) Pulse Rate:  [101-129] 101  (02/10 0416) Resp:  [18-19] 19  (02/10 0416) BP: (117-138)/(73-78) 117/73 mmHg (02/10 0416) SpO2:  [92 %-94 %] 94 % (02/10 0416) Weight:  [81.239 kg (179 lb 1.6 oz)] 81.239 kg (179 lb 1.6 oz) (02/10 0416)  Intake/Output from previous day: 02/09 0701 - 02/10 0700 In: 480 [P.O.:480] Out: 2076 [Urine:2075; Stool:1]   Physical Exam: AAO x 3, in NAD Tachy RR CTAB No signif edema   Lab Results:  Basename 03/04/11 0530  WBC 10.5  HGB 12.0*  PLT 172    Basename 03/04/11 0530  NA 137  K 3.8  CL 99  CO2 29  GLUCOSE 122*  BUN 12  CREATININE 0.76    Telemetry: Sinus tachy 100. Improved Personally viewed.   Assessment/Plan:   Abnormal EKG/ sinus tachycardia - improved with increased metop yest. Stable.   CAD - post CABG - progressing.    SKAINS, MARK 03/06/2011, 10:40 AM

## 2011-03-07 LAB — GLUCOSE, CAPILLARY
Glucose-Capillary: 119 mg/dL — ABNORMAL HIGH (ref 70–99)
Glucose-Capillary: 88 mg/dL (ref 70–99)
Glucose-Capillary: 109 mg/dL — ABNORMAL HIGH (ref 70–99)

## 2011-03-07 MED ORDER — SIMVASTATIN 20 MG PO TABS
20.0000 mg | ORAL_TABLET | Freq: Every day | ORAL | Status: DC
  Administered 2011-03-07: 20 mg via ORAL
  Filled 2011-03-07 (×3): qty 1

## 2011-03-07 MED ORDER — METOPROLOL TARTRATE 50 MG PO TABS: 75.0000 mg | ORAL_TABLET | Freq: Two times a day (BID) | ORAL | Status: DC

## 2011-03-07 MED ORDER — SIMVASTATIN 20 MG PO TABS: 20.0000 mg | ORAL_TABLET | Freq: Every evening | ORAL | Status: DC

## 2011-03-07 MED ORDER — ASPIRIN 325 MG PO TABS: 325.0000 mg | ORAL_TABLET | Freq: Every day | ORAL | Status: DC

## 2011-03-07 MED ORDER — TRAMADOL HCL 50 MG PO TABS: 50.0000 mg | ORAL_TABLET | ORAL | Status: AC | PRN

## 2011-03-07 NOTE — Progress Notes (Addendum)
Physician Discharge Summary  Patient ID: Joel Santiago MRN: [id number] DOB/AGE: [id number] 72 y.o.  Admit date: 02/26/2011 Discharge date: 03/08/2011  Admission Diagnoses: 1.NSTEMI 2.Multivessel CAD 3.History of hypertension 4.Historhy of DM 5.History of CVA 6.History of tobacco abuse  Discharge Diagnoses:  1.NSTEMI 2.Multivessel CAD 3.History of hypertension 4.Historhy of DM 5.History of CVA 6.History of tobacco abuse 7.Brief afib (converted to SR)   Procedure (s):  1.Cardiac Catheterization done by Dr. Mayford Knife on 02/28/2011.ANGIOGRAPHIC DATA: The left main coronary artery is calcified but widely patent. It bifurcates into a left anterior descending artery and left circumflex artery.  The left anterior descending artery is patent in its proximal portion and then give off a large first diagonal branch. The diagonal branches into a superior and inferior branch. The superior branch has an 70% stenosis. The mid LAD has an 80% stenosis. The ongoing LAD is widely patent.  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 It then gives rise to a first OM which is moderate in size and widely patent. The ongoing left circumflex is widely patent.  The right coronary artery is widely patent in throughout its course. It gives rise to 2 small acute RV marginal branches and then bifurcates into a posterior descending artery and posterolateral branch. The PDA has a 90% stenosis in the mid portion.  LEFT VENTRICULOGRAM: Left ventricular angiogram was done in the 30 RAO projection and revealed normal left ventricular wall motion and systolic function with an estimated ejection fraction of 50-55%. LVEDP was 8 mmHg.  2.Median sternotomy, extracorporeal circulation,  coronary artery bypass graft surgery x5 using a left internal mammary artery graft to the left anterior descending coronary artery with a saphenous vein graft to diagonal  branch of the LAD, a saphenous vein graft to the obtuse marginal branch of the left circumflex coronary  artery, and a sequential saphenous vein graft to the posterior descending and posterolateral branches of the right coronary artery. Endoscopic vein harvesting from the left leg by Dr. Laneta Simmers on 03/01/2011.  History of Presenting Illness: This is a  71 African American mlen with a past medical history  of HTN, HLD, type 2 DM who was admitted with chest pain. He had been moving and he kept having to stop picking up/moving boxes because of chest pain that was relieved by sitting down and resting for several minutes. He tells me he has had chest pain for several years that comes on with exertion; however, he can ride a bicycle for 2-3 miles (leisurely) without the onset of chest pain. He has been a diabetic for several years and is currently taking 500 mg metformin bid. He reports he had a cardiac cath at [facility] in 2005 or 2006 and says he had no significant diease at that time. He received 4 baby aspirin and was started on heparin gttp in the ER. POC troponin 0.13 but initial lab troponin negative with CKMB 5.3. He did rule in for a NSTEMI.  A cardiac catheterization was done by Dr. Mayford Knife on 02/28/2011  showed severe multivessel disease with good left ventricular function. Since catheterization he has had recurrent 10/10 chest pain brought on by minor activity such as sitting on the side of the bed to urinate and adjusting the covers on his bed. He has been started continued on heparin and nitroglycerin and has been receiving morphine sulfate and beta blockers. A cardiothoracic consultation was obtained with Dr. Laneta Simmers for the  consideration of coronary artery bypass grafting surgery. He was  pain-free at the time of his consultation with Dr. Laneta Simmers. Electrocardiogram done during his severe chest pain episode showed no significant change from his baseline with no acute ischemic changes.  Brief Hospital Course:    He was extubated without difficulty early the morning of postop day one. He remained afebrile and hemodynamically stable. His Swan-Ganz, A-line, chest tubes, and Foley were all removed early in his postoperative course. He was started on a low-dose beta blocker. He was found to be volume overloaded and diuresed accordingly. He was weaned off his insulin drip and restarted on his oral meds aforementioned as he was tolerating a diet. His glucose remained well controlled.He did have an episode of brief A. fib but then converted to sinus rhythm. He had some postoperative confusion as well;however, this did resolve. He was felt surgically stable for transfer from the intensive care unit to PCTU for further convalescence on 03/05/2011. He did have some loose stools and laxatives were discontinued . He then had some sinus tachycardia and his Lopressor was increased. His epicardial pacing wires were removed. He has already been  tolerating a diet. Chest tube sutures will be removed the morning of  discharge. He is continuing to progress with cardiac rehab. Provided he remains afebrile, hemodynamically stable, and pending around evaluation, he will be surgically stable for discharge on 03/08/2011.  Filed Vitals:   03/07/11 0902  BP: 117/75  Pulse: 101  Temp: 98.4  Resp: 18     Latest Vital Signs: Blood pressure 117/75, pulse 101, temperature 98.4 F (36.9 C), temperature source Oral, resp. rate 18, height 6' (1.829 m), weight 178 lb 12.7 oz (81.1 kg), SpO2 97.00%.  Physical Exam: Heart: RRR  Lungs: clear  Wound: clean and dry  Extremities: no significant edema  Discharge Condition:Stable  Recent laboratory studies:  Lab Results  Component Value Date   WBC 10.5 03/04/2011   HGB 12.0* 03/04/2011   HCT 36.9* 03/04/2011   MCV 86.0 03/04/2011   PLT 172 03/04/2011   Lab Results  Component Value Date   NA 137 03/04/2011   K 3.8 03/04/2011   CL 99 03/04/2011   CO2 29 03/04/2011   CREATININE 0.76 03/04/2011    GLUCOSE 122* 03/04/2011      Diagnostic Studies: Dg Chest Portable 1 View In Am  03/03/2011  *RADIOLOGY REPORT*  Clinical Data: Post CABG  PORTABLE CHEST - 1 VIEW  Comparison: Portable exam 0623 hours compared to 03/02/2011  Findings: Mild enlargement of cardiac silhouette post median sternotomy. Interval removal of endotracheal tube, nasogastric tube, mediastinal drain, Swan-Ganz catheter and left thoracostomy tube. Right jugular line remains, tip projecting over proximal SVC. Minimal pulmonary vascular congestion. Bibasilar atelectasis. No infiltrate, significant pleural effusion, or pneumothorax. Bones unremarkable.  IMPRESSION: Bibasilar atelectasis.  Original Report Authenticated By: Lollie Marrow, M.D.    Discharge Orders    Future Appointments: Provider: Department: Dept Phone: Center:   03/28/2011 1:00 PM [facility] [phone] [facility]      Discharge Medications: Medication List  As of 03/07/2011 12:33 PM   TAKE these medications         aspirin 325 MG tablet   Take 1 tablet (325 mg total) by mouth daily.      metFORMIN 1000 MG tablet   Commonly known as: GLUCOPHAGE   Take 1,000 mg by mouth daily.      metoprolol 50 MG tablet   Commonly known as: LOPRESSOR  Take 1.5 tablets (75 mg total) by mouth 2 (two) times daily.      simvastatin 20 MG tablet   Commonly known as: ZOCOR   Take 1 tablet (20 mg total) by mouth every evening.      traMADol 50 MG tablet   Commonly known as: ULTRAM   Take 1-2 tablets (50-100 mg total) by mouth every 4 (four) hours as needed for pain.            Follow Up Appointments: Follow-up Information    Follow up with Quintella Reichert, MD. (Call for an appointment for 2 weeks)    Contact information:   [address] [place] [address] [phone]       Follow up with medical doctor. (Call for a follow up appointment regarding further diabetes management)       Follow up with Alleen Borne, MD.  (PA/LAT CXR to be taken on 03/28/2011 at 12:00 pm;Appointment  is with Dr. Sharee Pimple PA on 03/28/2011 at 1:00 pm)    Contact information:   [address] [place] [address] [phone]          Signed: Doree Fudge MPA-C 03/07/2011, 12:33 PM

## 2011-03-07 NOTE — Progress Notes (Signed)
UR Completed.  Garen Woolbright Jane [phone] 03/07/2011  

## 2011-03-07 NOTE — Progress Notes (Signed)
Physical Therapy Treatment Patient Details Name: Joel Santiago MRN: [id number] DOB: [birth year] Today's Date: 03/07/2011  PT Assessment/Plan  PT - Assessment/Plan Comments on Treatment Session: Pt s/p CABG with much improved mentation and mobility today. Pt educated for sternal precautions and encouraged ambulation with staff. PT Plan: Discharge plan needs to be updated;Frequency remains appropriate Follow Up Recommendations: Home health PT, if pt meets all goals will be able to defer to cardiac rehab without further PT Equipment Recommended: Rolling walker with 5" wheels PT Goals  Acute Rehab PT Goals Pt will go Supine/Side to Sit: with modified independence PT Goal: Supine/Side to Sit - Progress: Updated due to goal met Pt will go Sit to Supine/Side: with modified independence PT Goal: Sit to Supine/Side - Progress: Updated due to goal met Pt will go Sit to Stand: with modified independence PT Goal: Sit to Stand - Progress: Updated due to goal met Pt will go Stand to Sit: with modified independence PT Goal: Stand to Sit - Progress: Updated due to goals met PT Goal: Ambulate - Progress: Met Additional Goals PT Goal: Additional Goal #1 - Progress: Progressing toward goal  PT Treatment Precautions/Restrictions  Precautions Precautions: Sternal Restrictions Weight Bearing Restrictions: No Mobility (including Balance) Bed Mobility Rolling Left: 5: Supervision Rolling Left Details (indicate cue type and reason): cueing to sequence Left Sidelying to Sit: 5: Supervision;HOB flat Left Sidelying to Sit Details (indicate cue type and reason): cueing for safety/sequence Sitting - Scoot to Edge of Bed: 6: Modified independent (Device/Increase time) Transfers Sit to Stand: 5: Supervision;From bed Sit to Stand Details (indicate cue type and reason): cueing for hand placement and safety Stand to Sit: To chair/3-in-1;5: Supervision Ambulation/Gait Ambulation/Gait Assistance: 6: Modified  independent (Device/Increase time) Ambulation/Gait Assistance Details (indicate cue type and reason): Excellent progression. Pt maintains shuffle gait and slow speed but reports he does not enjoy walking and is in no rush to be faster Ambulation Distance (Feet): 600 Feet Assistive device: Rolling walker Gait Pattern: Shuffle  Posture/Postural Control Posture/Postural Control: No significant limitations Exercise  General Exercises - Lower Extremity Long Arc Quad: AROM;Both;20 reps;Seated Hip Flexion/Marching: AROM;Both;Seated;Other reps (comment) Awilda Bill) End of Session PT - End of Session Equipment Utilized During Treatment: Gait belt Activity Tolerance: Patient tolerated treatment well Patient left: in chair;with call bell in reach;Other (comment) (chair alarm on) Nurse Communication: Mobility status for transfers General Behavior During Session: [facility] for tasks performed Cognition: [facility] for tasks performed  Delorse Lek 03/07/2011, 9:13 AM Toney Sang, PT [phone]

## 2011-03-07 NOTE — Progress Notes (Signed)
EPW discontinued per protocol. Tips intact. Patient tolerated well. Last INR INR/Prothrombin Time on   .  Patient advised Bedrest X 1 hour. Joel Santiago   

## 2011-03-07 NOTE — Progress Notes (Addendum)
                    [address]            [place] [address]          [phone]     6 Days Post-Op Procedure(s) (LRB): CORONARY ARTERY BYPASS GRAFTING (CABG) (N/A)  Subjective: Feeling better, still with occasional loose stools.    Objective: Vital signs in last 24 hours: Patient Vitals for the past 24 hrs:  BP Temp Temp src Pulse Resp SpO2 Weight  03/07/11 0902 - - - 101  - 97 % -  03/07/11 0600 117/75 mmHg 98.4 F (36.9 C) Oral 87  18  96 % 81.1 kg (178 lb 12.7 oz)  03/06/11 2200 114/74 mmHg 98.5 F (36.9 C) Oral 90  16  94 % -  03/06/11 1416 102/65 mmHg 97.6 F (36.4 C) - 82  18  95 % -  03/06/11 1045 148/62 mmHg - - - - - -   Current Weight  03/07/11 81.1 kg (178 lb 12.7 oz)   Pre-op wt= 84.8 kg  Intake/Output from previous day: 02/10 0701 - 02/11 0700 In: 720 [P.O.:720] Out: 876 [Urine:875; Stool:1]  CBGs [phone]  PHYSICAL EXAM:  Heart: RRR Lungs: clear Wound: clean and dry Extremities: no significant edema  Lab Results: CBC:No results found for this basename: WBC:2,HGB:2,HCT:2,PLT:2 in the last 72 hours BMET: No results found for this basename: NA:2,K:2,CL:2,CO2:2,GLUCOSE:2,BUN:2,CREATININE:2,CALCIUM:2 in the last 72 hours  PT/INR: No results found for this basename: LABPROT,INR in the last 72 hours   Assessment/Plan: S/P Procedure(s) (LRB): CORONARY ARTERY BYPASS GRAFTING (CABG) (N/A) CV- HR, BPs stable. Continue Lopressor. GI- watch loose stools.  Hold stool softeners. DM- sugars stable on home po meds. Continue PT/CRPI. Hopefully home in 1-2 days if remains stable.   LOS: 9 days    COLLINS,GINA H 03/07/2011    Chart reviewed, patient examined, agree with above. He is doing well and should be able to go home in am.

## 2011-03-07 NOTE — Progress Notes (Signed)
CARDIAC REHAB PHASE I   PRE:  Rate/Rhythm: 85SR  BP:  Supine:   Sitting: 126/70  Standing:    SaO2: 97%RA  MODE:  Ambulation: 330 ft   POST:  Rate/Rhythem: 92  BP:  Supine:   Sitting: 120/84  Standing:    SaO2: 96%RA 1415-1445 Pt walked 330 ft on RA with rolling walker and asst x 1 after bathroom trip. Tolerated well. To recliner after walk. Family  in room. Pt wants to go home.    Duanne Limerick

## 2011-03-07 NOTE — Progress Notes (Signed)
   CARE MANAGEMENT NOTE 03/07/2011  Patient:  Joel Santiago, Joel Santiago   Account Number:  [id number]  Date Initiated:  03/03/2011  Documentation initiated by:  [facility]  Subjective/Objective Assessment:   Post op CABG x5 on 03-01-11 - initially admitted with NSTEMI. Has spouse.     Action/Plan:   PTA, PT INDEPENDENT, LIVES WITH SPOUSE.   Anticipated DC Date:  03/07/2011   Anticipated DC Plan:  HOME W HOME HEALTH SERVICES      DC Planning Services  CM consult      [facility] Choice  HOME HEALTH   Choice offered to / List presented to:  C-1 Patient           Status of service:  In process, will continue to follow Medicare Important Message given?   (If response is "NO", the following Medicare IM given date fields will be blank) Date Medicare IM given:   Date Additional Medicare IM given:    Discharge Disposition:  HOME W HOME HEALTH SERVICES  Per UR Regulation:  Reviewed for med. necessity/level of care/duration of stay  Comments:  03/07/11 Joel Clouatre,RN,BSN 1600 MET WITH PT, WIFE AND DTR TO DISCUSS DC PLANS.  FAMILY TO PROVIDE 24H CARE AT DC.  WILL ARRANGE HHPT AS RECOMMENDED BY PHYS THERAPIST.  WOULD LIKELY BENEFIT FROM [facility] FOR RESTORATIVE CARE.  [place]. PROVIDER LIST GIVEN TO DAUGHTER TO REVIEW.  WILL CHECK BACK IN AM, PER HER REQUEST TO FOLLOW UP ON SELECTION.  PT DECLINES RW, WANTS TO USE [facility] FOR AMBULATION AT HOME. Phone #[phone]

## 2011-03-07 NOTE — Progress Notes (Signed)
CT sutures x 3 discontinued per MD order.  Pt tolerated well, no bleeding, incisions approximated, steris applied with betadine.  Will continue to monitor closely. Ave Filter

## 2011-03-08 NOTE — Progress Notes (Signed)
                    [address]            [place] [address]          [phone]     7 Days Post-Op Procedure(s) (LRB): CORONARY ARTERY BYPASS GRAFTING (CABG) (N/A)  Subjective: Getting dressed, ready to go home! Loose stools improved.  Objective: Vital signs in last 24 hours: Patient Vitals for the past 24 hrs:  BP Temp Temp src Pulse Resp SpO2 Weight  03/08/11 0416 123/75 mmHg 98.6 F (37 C) Oral 103  20  96 % 80.7 kg (177 lb 14.6 oz)  03/07/11 2056 108/71 mmHg 99.2 F (37.3 C) Oral 91  18  96 % -  03/07/11 1900 120/76 mmHg - - - - - -  03/07/11 1448 90/57 mmHg 97 F (36.1 C) Oral 85  18  97 % -  03/07/11 1145 114/75 mmHg 98.5 F (36.9 C) Oral 93  18  94 % -  03/07/11 1115 112/70 mmHg 98.4 F (36.9 C) Oral 89  18  95 % -  03/07/11 1045 114/77 mmHg 98.5 F (36.9 C) Oral 82  18  96 % -  03/07/11 1030 110/70 mmHg 98.6 F (37 C) Oral 92  18  96 % -  03/07/11 1015 108/76 mmHg 98.4 F (36.9 C) Oral 86  18  94 % -  03/07/11 1000 112/81 mmHg 98.1 F (36.7 C) Oral 74  16  97 % -  03/07/11 0902 - - - 101  - 97 % -   Current Weight  03/08/11 80.7 kg (177 lb 14.6 oz)   Pre-op wt= 84.8 kg  Intake/Output from previous day: 02/11 0701 - 02/12 0700 In: 720 [P.O.:720] Out: 800 [Urine:800]    PHYSICAL EXAM:  Heart:RRR Lungs: clear Wound:clean and dry Extremities:no LE edema  Lab Results: CBC:No results found for this basename: WBC:2,HGB:2,HCT:2,PLT:2 in the last 72 hours BMET: No results found for this basename: NA:2,K:2,CL:2,CO2:2,GLUCOSE:2,BUN:2,CREATININE:2,CALCIUM:2 in the last 72 hours  PT/INR: No results found for this basename: LABPROT,INR in the last 72 hours   Assessment/Plan: S/P Procedure(s) (LRB): CORONARY ARTERY BYPASS GRAFTING (CABG) (N/A) Stable, plan home today. Will arrange HHRN/PT/RW at pt request.   LOS: 10 days    Jacqulynn Shappell H 03/08/2011

## 2011-03-08 NOTE — Progress Notes (Signed)
Pt discharge instructions and patient education complete. IV site d/c. Site WNL. No s/s of distress. Discharged via wheelchair with family. Dion Saucier

## 2011-03-08 NOTE — Progress Notes (Signed)
[id number] Cardiac Rehab Completed discharge education with pt and family. He agrees to [employer]. CRP in [place], will send referral.

## 2011-03-09 NOTE — Discharge Summary (Signed)
Addendum Note to D/C Joel Santiago is a 71 y.o. male who is S/P Procedure(s): CORONARY ARTERY BYPASS GRAFTING (CABG).  He remained afebrile and vital signs stable. He had less loose stools with the . Home health, physical therapy, and a rolling walker were arranged.The patient is stable and ready for discharge.  No changes to History, Physical Exam, Meds or D/C instructions  Joel Santiago 03/09/2011 11:07 AM

## 2011-03-23 ENCOUNTER — Other Ambulatory Visit: Payer: Self-pay | Admitting: Surgery

## 2011-03-23 DIAGNOSIS — I2510 Atherosclerotic heart disease of native coronary artery without angina pectoris: Secondary | ICD-10-CM

## 2011-03-28 ENCOUNTER — Ambulatory Visit (INDEPENDENT_AMBULATORY_CARE_PROVIDER_SITE_OTHER): Payer: Self-pay | Admitting: Physician Assistant

## 2011-03-28 ENCOUNTER — Encounter: Payer: Self-pay | Admitting: Physician Assistant

## 2011-03-28 ENCOUNTER — Ambulatory Visit
Admission: RE | Admit: 2011-03-28 | Discharge: 2011-03-28 | Disposition: A | Discharge status: Home / Self Care | Payer: No Typology Code available for payment source | Source: Ambulatory Visit | Attending: Surgery | Admitting: Surgery

## 2011-03-28 VITALS — BP 121/74 | HR 78 | Resp 16 | Ht 72.0 in | Wt 180.0 lb

## 2011-03-28 DIAGNOSIS — I2510 Atherosclerotic heart disease of native coronary artery without angina pectoris: Secondary | ICD-10-CM

## 2011-03-28 DIAGNOSIS — Z09 Encounter for follow-up examination after completed treatment for conditions other than malignant neoplasm: Secondary | ICD-10-CM

## 2011-03-28 NOTE — Progress Notes (Signed)
  HPI:  Patient returns for routine postoperative follow-up having undergone a CABG x5 on 03/01/2011 by Dr. Laneta Simmers. Since hospital discharge the patient reports he has some tenderness proximal to the sternal wound. He denies any drainage, fever, chills, shortness of breath or chest pain . He is asking for another prescription for Ultram.   Current Outpatient Prescriptions  Medication Sig Dispense Refill  . aspirin 325 MG tablet Take 1 tablet (325 mg total) by mouth daily.      . metFORMIN (GLUCOPHAGE) 1000 MG tablet Take 1,000 mg by mouth daily.      . metoprolol tartrate (LOPRESSOR) 50 MG tablet Take 1.5 tablets (75 mg total) by mouth 2 (two) times daily.  90 tablet  1  . simvastatin (ZOCOR) 20 MG tablet Take 1 tablet (20 mg total) by mouth every evening.  30 tablet  1  Vital Signs: Blood pressure 121/74, heart rate 78, respiration rate 16, O2 sat 96% on room air.  Physical Exam: Cardiovascular: Regular rate and rhythm; no murmurs, gallop, or rub. Pulmonary: Clear also rotation bilaterally; or else, wheezes, rhonchi. Abdomen: Soft, nontender, bowel sounds present. Extremities: No cyanosis, clubbing, or edema. Wounds: Slight swelling proximal to the sternal wound but no erythema or drainage noted; left lower extremity wound clean dry well-healed  Diagnostic Tests: PA and lateral chest x-ray done today shows no pneumothorax, stable cardiomegaly,and lungs better aerated  Impression and Plan: Overall, Mr. Renette Butters he is surgically stable status post CABG x5 on 03/01/2011 and he has already seen the cardiologist in followup and there have been no changes made to his medications. Regarding his tenderness proximal to his sternal wound, he was instructed that should improve over time. There are no signs of infection. He was given a prescription for Ultram 50 mg one by mouth q. 4 hours when  necessary pain #40 with no refill. He wass instructed, providing is not taking any narcotics for pain, he  may begin driving short distances i.e. 30 minutes or less during the day and gradually increase his frequency and duration as tolerates. He was also instructed to continue with sternal precautions i.e. no lifting more than 10 pounds for the next 3-4 weeks. He has been contacted by cardiac rehabilitation and he is going to begin participation this Thursday. Going to return to see Dr. Laneta Simmers in 3-4 weeks. He was instructed to call if he has any problems, questions, or concerns in the  Interim.

## 2011-03-31 ENCOUNTER — Encounter (HOSPITAL_COMMUNITY)
Admission: RE | Admit: 2011-03-31 | Discharge: 2011-03-31 | Disposition: A | Discharge status: Home / Self Care | Payer: Medicare Other | Source: Ambulatory Visit | Attending: Cardiology | Admitting: Cardiology

## 2011-03-31 ENCOUNTER — Encounter (HOSPITAL_COMMUNITY): Payer: Self-pay

## 2011-03-31 NOTE — Progress Notes (Signed)
Cardiac Rehab Medication Review by a Pharmacist  Does the patient  feel that his/her medications are working for him/her?  yes  Has the patient been experiencing any side effects to the medications prescribed?  no  Does the patient measure his/her own blood pressure or blood glucose at home?  yes   Does the patient have any problems obtaining medications due to transportation or finances?   no  Understanding of regimen: good Understanding of indications: good Potential of compliance: good    Pharmacist comments:   Bayard Beaver. Saul Fordyce, PharmD  03/31/2011 9:07 AM

## 2011-04-04 ENCOUNTER — Encounter (HOSPITAL_COMMUNITY): Payer: Medicare Other

## 2011-04-06 ENCOUNTER — Encounter (HOSPITAL_COMMUNITY): Payer: Medicare Other

## 2011-04-07 NOTE — Progress Notes (Signed)
Joel Santiago 71 y.o. male       Nutrition Screen                                                                    YES  NO Do you live in a nursing home?  X   Do you eat out more than 3 times/week?    X If yes, how many times per week do you eat out?  Do you have food allergies?   X If yes, what are you allergic to?  Have you gained or lost more than 10 lbs without trying?               X If yes, how much weight have you lost and over what time period?  lbs gained or lost over  weeks/month  Do you want to lose weight?     X If yes, what is a goal weight or amount of weight you would like to lose?  lb  Do you eat alone most of the time?   X   Do you eat less than 2 meals/day?  X If yes, how many meals do you eat? 3  Do you drink more than 3 alcohol drinks/day?  X If yes, how many drinks per day?  Are you having trouble with constipation? * X  If yes, what are you doing to help relieve constipation? Fish oil  Do you have financial difficulties with buying food?*    X   Are you experiencing regular nausea/ vomiting?*     X   Do you have a poor appetite? *                                        X   Do you have trouble chewing/swallowing? *   X    Pt with diagnoses of:  X CABG              X Dyslipidemia  / HDL< 40 / LDL>70 / High TG      X %  Body fat >goal / Body Mass Index >25 X HTN / BP >120/80 X MI X DM/A1c >6 / CBG >126       Pt Risk Score   5       Diagnosis Risk Score  75       Total Risk Score   80                        X High Risk                Low Risk    HT: 71.25" Ht Readings from Last 1 Encounters:  03/31/11 5' 11.25" (1.81 m)    WT:   183 lb (83.3 kg) Wt Readings from Last 3 Encounters:  03/31/11 183 lb 10.3 oz (83.3 kg)  03/28/11 180 lb (81.647 kg)  03/08/11 177 lb 14.6 oz (80.7 kg)     IBW 78.9 106%IBW BMI 25.4 28.3%body fat  Meds reviewed: Metformin, Fish oil Past Medical History  Diagnosis Date  . Diabetes mellitus   . Hypertension   . Stroke   .  Angina   Activity level: Pt is active   Wt goal: 183 lb ( 83.3 kg) Current tobacco use? Yes If pt using tobacco, is pt taking a vit C? No  Food/Drug Interaction? No       Labs:  Lipid Panel     Component Value Date/Time   CHOL 200 02/27/2011 0917   TRIG 87 02/27/2011 0917   HDL 45 02/27/2011 0917   CHOLHDL 4.4 02/27/2011 0917   VLDL 17 02/27/2011 0917   LDLCALC 138* 02/27/2011 0917   Lab Results  Component Value Date   HGBA1C 5.9* 02/27/2011   03/07/10 Glucose 138  LDL goal: < 70      DM and > 2:      HTN, > 72 yo male Estimated Daily Nutrition Needs for: ? wt  maintenance 2400-2750 Kcal , Total Fat 80-90gm, Saturated Fat 18-21 gm, Trans Fat 2.6-3.1 gm,  Sodium less than 1500 mg, Grams of CHO 325-425

## 2011-04-08 ENCOUNTER — Encounter (HOSPITAL_COMMUNITY): Payer: Medicare Other

## 2011-04-11 ENCOUNTER — Encounter (HOSPITAL_COMMUNITY): Payer: Medicare Other

## 2011-04-13 ENCOUNTER — Encounter (HOSPITAL_COMMUNITY): Payer: Medicare Other

## 2011-04-15 ENCOUNTER — Encounter (HOSPITAL_COMMUNITY): Payer: Medicare Other

## 2011-04-18 ENCOUNTER — Encounter (HOSPITAL_COMMUNITY): Payer: Medicare Other

## 2011-04-19 ENCOUNTER — Ambulatory Visit (INDEPENDENT_AMBULATORY_CARE_PROVIDER_SITE_OTHER): Payer: Self-pay | Admitting: Surgery

## 2011-04-19 ENCOUNTER — Encounter: Payer: Self-pay | Admitting: Surgery

## 2011-04-19 VITALS — BP 119/70 | HR 88 | Resp 20 | Ht 72.0 in | Wt 188.0 lb

## 2011-04-19 DIAGNOSIS — I2510 Atherosclerotic heart disease of native coronary artery without angina pectoris: Secondary | ICD-10-CM

## 2011-04-19 DIAGNOSIS — Z9889 Other specified postprocedural states: Secondary | ICD-10-CM

## 2011-04-19 DIAGNOSIS — Z951 Presence of aortocoronary bypass graft: Secondary | ICD-10-CM

## 2011-04-19 NOTE — Progress Notes (Signed)
                   [address]            [place] [address]          [phone]     HPI:  Patient returns for routine postoperative follow-up having undergone coronary bypass graft surgery x5 on 03/01/2011. The patient's early postoperative recovery while in the hospital was notable for an uncomplicated postoperative course. Since hospital discharge the patient reports he has been feeling well. He is walking daily without chest pain or shortness of breath.   Current Outpatient Prescriptions  Medication Sig Dispense Refill  . aspirin 81 MG tablet Take 81 mg by mouth daily.      . fish oil-omega-3 fatty acids 1000 MG capsule Take 1 capsule by mouth 3 (three) times daily with meals.      . metFORMIN (GLUCOPHAGE) 1000 MG tablet Take 1,000 mg by mouth daily.      . metoprolol (LOPRESSOR) 50 MG tablet Take 100 mg by mouth 2 (two) times daily.      . simvastatin (ZOCOR) 20 MG tablet Take 1 tablet (20 mg total) by mouth every evening.  30 tablet  1  . traMADol (ULTRAM) 50 MG tablet Take 50 mg by mouth every 4 (four) hours as needed.        Physical Exam: BP 119/70  Pulse 88  Resp 20  Ht 6' (1.829 m)  Wt 188 lb (85.276 kg)  BMI 25.50 kg/m2  SpO2 98% He looks well. Cardiac exam shows regular rate and rhythm with normal heart sounds. Lungs are clear. Chest incision is healing well and the sternum stable. The leg incision is healing well and there is no lower extremity edema.  Diagnostic Tests:     *RADIOLOGY REPORT*   Clinical Data: Post CABG on 03/01/2011, follow-up   CHEST - 2 VIEW   Comparison: Chest x-ray of 03/03/2011   Findings: The lungs are better aerated.  There may be tiny effusions slightly blunting the posterior costophrenic angles.  No pneumothorax is seen.  Mild cardiomegaly is stable.  Median sternotomy sutures are noted.   IMPRESSION: Improved aeration.  Stable mild cardiomegaly.   Original Report Authenticated By: Joel Santiago, M.D.       Impression:  Overall he is doing well following coronary artery bypass graft surgery. I gave him permission to return to driving a car at this time but he should refrain from lifting anything heavier than 10 pounds for a total of 3 months from date of surgery.  Plan:  He is not sure whether he is going to continue living in [place] or is going to move back to [place]. I encouraged him to continue ongoing medical followup so that he can keep his risk factors under the best control possible.

## 2011-04-20 ENCOUNTER — Encounter (HOSPITAL_COMMUNITY): Admission: RE | Admit: 2011-04-20 | Payer: Medicare Other | Source: Ambulatory Visit

## 2011-04-22 ENCOUNTER — Encounter (HOSPITAL_COMMUNITY): Payer: Medicare Other

## 2011-04-25 ENCOUNTER — Encounter (HOSPITAL_COMMUNITY): Payer: Medicare Other

## 2011-04-26 ENCOUNTER — Other Ambulatory Visit: Payer: Self-pay | Admitting: Physician Assistant

## 2011-04-27 ENCOUNTER — Other Ambulatory Visit: Payer: Self-pay | Admitting: Physician Assistant

## 2011-04-27 ENCOUNTER — Encounter (HOSPITAL_COMMUNITY): Payer: Medicare Other

## 2011-04-29 ENCOUNTER — Encounter (HOSPITAL_COMMUNITY): Payer: Medicare Other

## 2011-05-02 ENCOUNTER — Encounter (HOSPITAL_COMMUNITY): Payer: Medicare Other

## 2011-05-04 ENCOUNTER — Encounter (HOSPITAL_COMMUNITY): Payer: Medicare Other

## 2011-05-06 ENCOUNTER — Encounter (HOSPITAL_COMMUNITY): Payer: Medicare Other

## 2011-05-09 ENCOUNTER — Encounter (HOSPITAL_COMMUNITY): Payer: Medicare Other

## 2011-05-11 ENCOUNTER — Encounter (HOSPITAL_COMMUNITY): Payer: Medicare Other

## 2011-05-13 ENCOUNTER — Encounter (HOSPITAL_COMMUNITY): Payer: Medicare Other

## 2011-05-16 ENCOUNTER — Encounter (HOSPITAL_COMMUNITY): Payer: Medicare Other

## 2011-05-18 ENCOUNTER — Encounter (HOSPITAL_COMMUNITY): Payer: Medicare Other

## 2011-05-20 ENCOUNTER — Encounter (HOSPITAL_COMMUNITY): Payer: Medicare Other

## 2011-05-23 ENCOUNTER — Encounter (HOSPITAL_COMMUNITY): Payer: Medicare Other

## 2011-05-25 ENCOUNTER — Encounter (HOSPITAL_COMMUNITY): Payer: Medicare Other

## 2011-05-27 ENCOUNTER — Encounter (HOSPITAL_COMMUNITY): Payer: Medicare Other

## 2011-05-30 ENCOUNTER — Encounter (HOSPITAL_COMMUNITY): Payer: Medicare Other

## 2011-06-01 ENCOUNTER — Encounter (HOSPITAL_COMMUNITY): Payer: Medicare Other

## 2011-06-03 ENCOUNTER — Encounter (HOSPITAL_COMMUNITY): Payer: Medicare Other

## 2011-06-06 ENCOUNTER — Encounter (HOSPITAL_COMMUNITY): Payer: Medicare Other

## 2011-06-08 ENCOUNTER — Encounter (HOSPITAL_COMMUNITY): Payer: Medicare Other

## 2011-06-10 ENCOUNTER — Encounter (HOSPITAL_COMMUNITY): Payer: Medicare Other

## 2011-06-13 ENCOUNTER — Encounter (HOSPITAL_COMMUNITY): Payer: Medicare Other

## 2011-06-15 ENCOUNTER — Encounter (HOSPITAL_COMMUNITY): Payer: Medicare Other

## 2011-06-17 ENCOUNTER — Encounter (HOSPITAL_COMMUNITY): Payer: Medicare Other

## 2011-06-20 ENCOUNTER — Encounter (HOSPITAL_COMMUNITY): Payer: Medicare Other

## 2011-06-22 ENCOUNTER — Encounter (HOSPITAL_COMMUNITY): Payer: Medicare Other

## 2011-06-24 ENCOUNTER — Encounter (HOSPITAL_COMMUNITY): Payer: Medicare Other

## 2011-06-27 ENCOUNTER — Encounter (HOSPITAL_COMMUNITY): Payer: Medicare Other

## 2011-06-29 ENCOUNTER — Encounter (HOSPITAL_COMMUNITY): Payer: Medicare Other

## 2011-07-01 ENCOUNTER — Encounter (HOSPITAL_COMMUNITY): Payer: Medicare Other

## 2011-07-04 ENCOUNTER — Encounter (HOSPITAL_COMMUNITY): Payer: Medicare Other

## 2011-07-06 ENCOUNTER — Encounter (HOSPITAL_COMMUNITY): Payer: Medicare Other

## 2011-07-08 ENCOUNTER — Encounter (HOSPITAL_COMMUNITY): Payer: Medicare Other

## 2011-08-26 ENCOUNTER — Encounter (HOSPITAL_COMMUNITY)
Admission: EM | Disposition: A | Discharge status: Home / Self Care | Payer: Self-pay | Source: Home / Self Care | Attending: Interventional Cardiology

## 2011-08-26 ENCOUNTER — Encounter (HOSPITAL_COMMUNITY): Payer: Self-pay | Admitting: Emergency Medicine

## 2011-08-26 ENCOUNTER — Emergency Department (HOSPITAL_COMMUNITY): Payer: Medicare Other

## 2011-08-26 ENCOUNTER — Inpatient Hospital Stay (HOSPITAL_COMMUNITY)
Admission: EM | Admit: 2011-08-26 | Discharge: 2011-08-28 | DRG: 247 | Disposition: A | Discharge status: Home / Self Care | Payer: Medicare Other | Attending: Interventional Cardiology | Admitting: Interventional Cardiology

## 2011-08-26 DIAGNOSIS — I119 Hypertensive heart disease without heart failure: Secondary | ICD-10-CM | POA: Diagnosis present

## 2011-08-26 DIAGNOSIS — E1159 Type 2 diabetes mellitus with other circulatory complications: Secondary | ICD-10-CM | POA: Diagnosis present

## 2011-08-26 DIAGNOSIS — E785 Hyperlipidemia, unspecified: Secondary | ICD-10-CM | POA: Diagnosis present

## 2011-08-26 DIAGNOSIS — F17200 Nicotine dependence, unspecified, uncomplicated: Secondary | ICD-10-CM | POA: Diagnosis present

## 2011-08-26 DIAGNOSIS — I219 Acute myocardial infarction, unspecified: Secondary | ICD-10-CM

## 2011-08-26 DIAGNOSIS — I214 Non-ST elevation (NSTEMI) myocardial infarction: Principal | ICD-10-CM

## 2011-08-26 DIAGNOSIS — I25810 Atherosclerosis of coronary artery bypass graft(s) without angina pectoris: Secondary | ICD-10-CM | POA: Diagnosis present

## 2011-08-26 DIAGNOSIS — Z8673 Personal history of transient ischemic attack (TIA), and cerebral infarction without residual deficits: Secondary | ICD-10-CM

## 2011-08-26 DIAGNOSIS — I10 Essential (primary) hypertension: Secondary | ICD-10-CM | POA: Diagnosis present

## 2011-08-26 DIAGNOSIS — I2510 Atherosclerotic heart disease of native coronary artery without angina pectoris: Secondary | ICD-10-CM | POA: Diagnosis present

## 2011-08-26 HISTORY — PX: LEFT HEART CATH: SHX5478

## 2011-08-26 LAB — POCT I-STAT TROPONIN I: Troponin i, poc: 6.18 ng/mL (ref 0.00–0.08)

## 2011-08-26 LAB — MRSA PCR SCREENING: MRSA by PCR: NEGATIVE

## 2011-08-26 LAB — BASIC METABOLIC PANEL
BUN: 11 mg/dL (ref 6–23)
Creatinine, Ser: 0.69 mg/dL (ref 0.50–1.35)
GFR calc Af Amer: >90 mL/min (ref >90)
GFR calc non Af Amer: >90 mL/min (ref >90)
Glucose, Bld: 135 mg/dL — ABNORMAL HIGH (ref 70–99)

## 2011-08-26 LAB — CBC
HCT: 45.8 % (ref 39.0–52.0)
MCH: 28.4 pg (ref 26.0–34.0)
MCHC: 32.5 g/dL (ref 30.0–36.0)
MCV: 87.4 fL (ref 78.0–100.0)
RDW: 13.7 % (ref 11.5–15.5)

## 2011-08-26 LAB — PROTIME-INR
INR: 1.08 (ref 0.00–1.49)
INR: 2 — ABNORMAL HIGH (ref 0.00–1.49)
Prothrombin Time: 23 seconds — ABNORMAL HIGH (ref 11.6–15.2)

## 2011-08-26 LAB — GLUCOSE, CAPILLARY: Glucose-Capillary: 103 mg/dL — ABNORMAL HIGH (ref 70–99)

## 2011-08-26 LAB — CARDIAC PANEL(CRET KIN+CKTOT+MB+TROPI): Total CK: 1241 U/L — ABNORMAL HIGH (ref 7–232)

## 2011-08-26 SURGERY — LEFT HEART CATHETERIZATION WITH CORONARY/GRAFT ANGIOGRAM
Anesthesia: Moderate Sedation

## 2011-08-26 SURGERY — LEFT HEART CATH
Anesthesia: LOCAL

## 2011-08-26 MED ORDER — SODIUM CHLORIDE 0.9 % IV SOLN: INTRAVENOUS | Status: DC

## 2011-08-26 MED ORDER — ONDANSETRON HCL 4 MG/2ML IJ SOLN: 4.0000 mg | Freq: Four times a day (QID) | INTRAMUSCULAR | Status: DC | PRN

## 2011-08-26 MED ORDER — METOPROLOL TARTRATE 25 MG PO TABS
25.0000 mg | ORAL_TABLET | Freq: Two times a day (BID) | ORAL | Status: DC
  Administered 2011-08-26 – 2011-08-28 (×4): 25 mg via ORAL
  Filled 2011-08-26 (×5): qty 1

## 2011-08-26 MED ORDER — ACETAMINOPHEN 325 MG PO TABS: 650.0000 mg | ORAL_TABLET | ORAL | Status: DC | PRN

## 2011-08-26 MED ORDER — NITROGLYCERIN 0.2 MG/ML ON CALL CATH LAB
INTRAVENOUS | Status: AC
  Filled 2011-08-26: qty 1

## 2011-08-26 MED ORDER — ASPIRIN 81 MG PO CHEW
CHEWABLE_TABLET | ORAL | Status: AC
  Filled 2011-08-26: qty 4

## 2011-08-26 MED ORDER — METFORMIN HCL 500 MG PO TABS
1000.0000 mg | ORAL_TABLET | Freq: Two times a day (BID) | ORAL | Status: DC
  Filled 2011-08-26: qty 2

## 2011-08-26 MED ORDER — BIVALIRUDIN 250 MG IV SOLR
INTRAVENOUS | Status: AC
  Filled 2011-08-26: qty 250

## 2011-08-26 MED ORDER — HEPARIN (PORCINE) IN NACL 100-0.45 UNIT/ML-% IJ SOLN
1000.0000 [IU]/h | INTRAMUSCULAR | Status: DC
  Administered 2011-08-26: 1000 [IU]/h via INTRAVENOUS
  Filled 2011-08-26: qty 250

## 2011-08-26 MED ORDER — NITROGLYCERIN IN D5W 200-5 MCG/ML-% IV SOLN
2.0000 ug/min | INTRAVENOUS | Status: DC
  Administered 2011-08-26: 5 ug/min via INTRAVENOUS
  Administered 2011-08-27: 10 ug/min via INTRAVENOUS
  Filled 2011-08-26: qty 250

## 2011-08-26 MED ORDER — ASPIRIN 81 MG PO CHEW
324.0000 mg | CHEWABLE_TABLET | ORAL | Status: DC
  Filled 2011-08-26: qty 4

## 2011-08-26 MED ORDER — TICAGRELOR 90 MG PO TABS
90.0000 mg | ORAL_TABLET | Freq: Two times a day (BID) | ORAL | Status: DC
  Administered 2011-08-27 – 2011-08-28 (×3): 90 mg via ORAL
  Filled 2011-08-26 (×4): qty 1

## 2011-08-26 MED ORDER — ALPRAZOLAM 0.25 MG PO TABS: 0.2500 mg | ORAL_TABLET | Freq: Two times a day (BID) | ORAL | Status: DC | PRN

## 2011-08-26 MED ORDER — ASPIRIN EC 81 MG PO TBEC: 81.0000 mg | DELAYED_RELEASE_TABLET | Freq: Every day | ORAL | Status: DC

## 2011-08-26 MED ORDER — NITROGLYCERIN 0.4 MG SL SUBL: 0.4000 mg | SUBLINGUAL_TABLET | SUBLINGUAL | Status: DC | PRN

## 2011-08-26 MED ORDER — METFORMIN HCL 500 MG PO TABS
1000.0000 mg | ORAL_TABLET | Freq: Two times a day (BID) | ORAL | Status: DC
  Administered 2011-08-28: 1000 mg via ORAL
  Filled 2011-08-26 (×3): qty 2

## 2011-08-26 MED ORDER — TICAGRELOR 90 MG PO TABS
ORAL_TABLET | ORAL | Status: AC
  Filled 2011-08-26: qty 2

## 2011-08-26 MED ORDER — DIAZEPAM 5 MG PO TABS
ORAL_TABLET | ORAL | Status: AC
  Filled 2011-08-26: qty 1

## 2011-08-26 MED ORDER — ASPIRIN 300 MG RE SUPP
300.0000 mg | RECTAL | Status: DC
  Filled 2011-08-26: qty 1

## 2011-08-26 MED ORDER — FENTANYL CITRATE 0.05 MG/ML IJ SOLN
INTRAMUSCULAR | Status: AC
  Filled 2011-08-26: qty 2

## 2011-08-26 MED ORDER — ASPIRIN 81 MG PO CHEW: 81.0000 mg | CHEWABLE_TABLET | Freq: Every day | ORAL | Status: DC

## 2011-08-26 MED ORDER — DIAZEPAM 5 MG PO TABS
5.0000 mg | ORAL_TABLET | ORAL | Status: AC
  Administered 2011-08-26: 5 mg via ORAL

## 2011-08-26 MED ORDER — MORPHINE SULFATE 2 MG/ML IJ SOLN: 1.0000 mg | INTRAMUSCULAR | Status: DC | PRN

## 2011-08-26 MED ORDER — HEPARIN BOLUS VIA INFUSION
4000.0000 [IU] | Freq: Once | INTRAVENOUS | Status: AC
  Administered 2011-08-26: 4000 [IU] via INTRAVENOUS

## 2011-08-26 MED ORDER — SODIUM CHLORIDE 0.9 % IJ SOLN: 3.0000 mL | Freq: Two times a day (BID) | INTRAMUSCULAR | Status: DC

## 2011-08-26 MED ORDER — SODIUM CHLORIDE 0.9 % IV SOLN: 1.0000 mL/kg/h | INTRAVENOUS | Status: DC

## 2011-08-26 MED ORDER — ATORVASTATIN CALCIUM 80 MG PO TABS
80.0000 mg | ORAL_TABLET | Freq: Every day | ORAL | Status: DC
  Administered 2011-08-26 – 2011-08-27 (×2): 80 mg via ORAL
  Filled 2011-08-26 (×3): qty 1

## 2011-08-26 MED ORDER — SODIUM CHLORIDE 0.9 % IV SOLN: 250.0000 mL | INTRAVENOUS | Status: DC | PRN

## 2011-08-26 MED ORDER — ASPIRIN 81 MG PO CHEW
81.0000 mg | CHEWABLE_TABLET | Freq: Every day | ORAL | Status: DC
  Administered 2011-08-27 – 2011-08-28 (×2): 81 mg via ORAL
  Filled 2011-08-26 (×2): qty 1

## 2011-08-26 MED ORDER — ACETAMINOPHEN 500 MG PO TABS
1000.0000 mg | ORAL_TABLET | Freq: Four times a day (QID) | ORAL | Status: DC | PRN
  Filled 2011-08-26: qty 2

## 2011-08-26 MED ORDER — LIDOCAINE HCL (PF) 1 % IJ SOLN
INTRAMUSCULAR | Status: AC
  Filled 2011-08-26: qty 30

## 2011-08-26 MED ORDER — HEPARIN (PORCINE) IN NACL 2-0.9 UNIT/ML-% IJ SOLN
INTRAMUSCULAR | Status: AC
  Filled 2011-08-26: qty 2000

## 2011-08-26 MED ORDER — SODIUM CHLORIDE 0.9 % IJ SOLN: 3.0000 mL | INTRAMUSCULAR | Status: DC | PRN

## 2011-08-26 MED ORDER — NITROGLYCERIN 0.4 MG SL SUBL
0.4000 mg | SUBLINGUAL_TABLET | SUBLINGUAL | Status: DC | PRN
  Administered 2011-08-26 (×3): 0.4 mg via SUBLINGUAL

## 2011-08-26 MED ORDER — MIDAZOLAM HCL 2 MG/2ML IJ SOLN
INTRAMUSCULAR | Status: AC
  Filled 2011-08-26: qty 2

## 2011-08-26 MED ORDER — ASPIRIN 81 MG PO CHEW
324.0000 mg | CHEWABLE_TABLET | Freq: Once | ORAL | Status: AC
  Administered 2011-08-26: 324 mg via ORAL

## 2011-08-26 MED ORDER — ASPIRIN 81 MG PO CHEW: 324.0000 mg | CHEWABLE_TABLET | Freq: Once | ORAL | Status: DC

## 2011-08-26 MED ORDER — ASPIRIN 81 MG PO CHEW
324.0000 mg | CHEWABLE_TABLET | ORAL | Status: AC
  Administered 2011-08-26: 324 mg via ORAL

## 2011-08-26 NOTE — ED Notes (Signed)
Pt c/o midsternal CP into back and abd pain starting yesterday; pt sts some SOB and nausea

## 2011-08-26 NOTE — ED Notes (Signed)
Charge RN notified of need for room for pt due to abnormal labs

## 2011-08-26 NOTE — H&P (Signed)
Admit date: 08/26/2011 Referring Physician Dr. Orvan Falconer Primary CardiologistTurner Chief complaint/reason for admission:chest pain  HPI: 71 year old man who has had chest discomfort for the past 3 days.  This does feel like the discomfort he had with his bypass surgery.  He says walking and deep breathing helps improve the pain.  He stopped smoking a few days ago because of this pain.  He says it hurts worse in his abdomen and it doesn't his chest.  His initial cardiac enzymes in the emergency room were abnormal so we are called to evaluate.  He received 3 sublingual nitroglycerin and his chest discomfort went down from an 8/10 to a 5/10.  Currently, intravenous nitroglycerin is being started and IV heparin is being started as well.  He assures me that he is not going to smoke anymore.  He does not report any current shortness of breath, sweating or vomiting.  He does have abdominal pain.    PMH:    Past Medical History  Diagnosis Date  . Diabetes mellitus   . Hypertension   . Stroke   . Angina     PSH:    Past Surgical History  Procedure Date  . Coronary artery bypass graft 03/01/2011    Procedure: CORONARY ARTERY BYPASS GRAFTING (CABG);  Surgeon: Alleen Borne, MD;  Location: [facility] OR;  Service: Open Heart Surgery;  Laterality: N/A;  Coronary artery bypass graft times five on pump using left internal mammary artery and right greater saphenous vein via endovein harvest.    ALLERGIES:   Review of patient's allergies indicates no known allergies.  Prior to Admit Meds:   (Not in a hospital admission) Family HX:   History reviewed. No pertinent family history. Social HX:    History   Social History  . Marital Status: Married    Spouse Name: N/A    Number of Children: N/A  . Years of Education: N/A   Occupational History  . Not on file.   Social History Main Topics  . Smoking status: Current Everyday Smoker -- 0.5 packs/day    Types: Cigarettes  . Smokeless tobacco: Never Used  .  Alcohol Use: No  . Drug Use: No  . Sexually Active: Yes   Other Topics Concern  . Not on file   Social History Narrative  . No narrative on file     ROS:  All 11 ROS were addressed and are negative except what is stated in the HPI  PHYSICAL EXAM Filed Vitals:   08/26/11 1645  BP: 160/77  Pulse: 69  Temp:   Resp: 16   General: Well developed, well nourished, in no acute distress Head:    Normal cephalic and atramatic  Lungs:   Clear bilaterally to auscultation. Heart:   HRRR S1 S2 Abdomen: Soft, mild tenderness, no rebound, no guarding Msk:  Normal strength and tone for age. Extremities:   No  edema.  DP +1 Neuro: Alert and oriented X 3. Psych:  Normal affect, responds appropriately   Labs:   Lab Results  Component Value Date   WBC 8.3 08/26/2011   HGB 14.9 08/26/2011   HCT 45.8 08/26/2011   MCV 87.4 08/26/2011   PLT 176 08/26/2011    Lab 08/26/11 1430  NA 138  K 4.1  CL 100  CO2 24  BUN 11  CREATININE 0.69  CALCIUM 9.0  PROT --  BILITOT --  ALKPHOS --  ALT --  AST --  GLUCOSE 135*   Lab Results  Component  Value Date   CKTOTAL 217 02/27/2011   CKMB 6.3* 02/27/2011   TROPONINI 0.41* 02/27/2011   No results found for this basename: PTT   Lab Results  Component Value Date   INR 1.40 03/01/2011   INR 1.07 02/27/2011   INR 1.05 02/26/2011     Lab Results  Component Value Date   CHOL 200 02/27/2011   CHOL 197 02/27/2011   Lab Results  Component Value Date   HDL 45 02/27/2011   HDL 41 02/27/2011   Lab Results  Component Value Date   LDLCALC 138* 02/27/2011   LDLCALC 137* 02/27/2011   Lab Results  Component Value Date   TRIG 87 02/27/2011   TRIG 94 02/27/2011   Lab Results  Component Value Date   CHOLHDL 4.4 02/27/2011   CHOLHDL 4.8 02/27/2011   No results found for this basename: LDLDIRECT      Radiology:  No significant abnormality  EKG:  Normal sinus rhythm, prior  inferior MI, No acute ST segment changes  ASSESSMENT: Non-STEMI, tobacco abuse  PLAN:  Admit to  step down unit for further evaluation.  He'll be placed on telemetry.  Continue aspirin, beta blocker, statin.  Start IV nitroglycerin and IV heparin.  He will need a cardiac catheterization.  Timing of this will depend on if he can be made pain-free.  We'll reassess in an hour or so after the medications have been started through his IV.  If pain is not relieved , will do the cardiac cath tonight.    He needs to stop smoking.  Corky Crafts., MD  08/26/2011  4:56 PM

## 2011-08-26 NOTE — ED Notes (Signed)
Cardiology at the bedside.

## 2011-08-26 NOTE — Progress Notes (Signed)
ANTICOAGULATION CONSULT NOTE - Initial Consult  Pharmacy Consult for Heparin Indication: chest pain/ACS  No Known Allergies  Patient Measurements:   Wt Readings from Last 3 Encounters:  04/19/11 188 lb (85.276 kg)  03/31/11 183 lb 10.3 oz (83.3 kg)  03/28/11 180 lb (81.647 kg)    Vital Signs: Temp: 98.5 F (36.9 C) (08/02 1420) Temp src: Oral (08/02 1420) BP: 160/77 mmHg (08/02 1645) Pulse Rate: 69  (08/02 1645)  Labs:  Basename 08/26/11 1554 08/26/11 1430  HGB -- 14.9  HCT -- 45.8  PLT -- 176  APTT -- --  LABPROT 14.2 --  INR 1.08 --  HEPARINUNFRC -- --  CREATININE -- 0.69  CKTOTAL -- --  CKMB -- --  TROPONINI -- --    The CrCl is unknown because both a height and weight (above a minimum accepted value) are required for this calculation.   Medical History: Past Medical History  Diagnosis Date  . Diabetes mellitus   . Hypertension   . Stroke   . Angina     Medications:  Infusions:    . heparin 1,000 Units/hr (08/26/11 1656)  . nitroGLYCERIN 5 mcg/min (08/26/11 1646)    Assessment: Chest Pain:  To continue anticoagulation with Heparin.  His current rate is ~12 units/kg/hr, which is acceptable for a chest pain indication.  Goal of Therapy:  Heparin level 0.3-0.7 units/ml Monitor platelets by anticoagulation protocol: Yes   Plan:  Continue Heparin at 1000 units/hr Check Heparin level in 8 hours. Daily Heparin level and CBC.  Estella Husk, Pharm.D., BCPS Clinical Pharmacist  Phone [phone] Pager [phone] 08/26/2011, 5:04 PM

## 2011-08-26 NOTE — Progress Notes (Signed)
Complained of chestpain scale 7/10 burning sensation, md made aware, nitro gtt, increased to 30 mcg/hr. Continue to monitor.

## 2011-08-26 NOTE — ED Provider Notes (Signed)
History     CSN: [id number]  Arrival date & time 08/26/11  1414   First MD Initiated Contact with Patient 08/26/11 1558      Chief Complaint  Patient presents with  . Chest Pain  . Back Pain  . Abdominal Pain    (Consider location/radiation/quality/duration/timing/severity/associated sxs/prior treatment) Patient is a 71 y.o. male presenting with chest pain. The history is provided by the patient.  Chest Pain The chest pain began 3 - 5 days ago (3). Chest pain occurs constantly. The chest pain is unchanged. The pain is associated with exertion. At its most intense, the pain is at 8/10. The pain is currently at 5/10. The quality of the pain is described as burning and pressure-like. The pain does not radiate. Chest pain is worsened by exertion. Primary symptoms include fatigue, shortness of breath and nausea. Pertinent negatives for primary symptoms include no fever, no syncope, no cough, no wheezing, no palpitations, no vomiting and no dizziness.  Pertinent negatives for associated symptoms include no lower extremity edema, no near-syncope, no orthopnea, no paroxysmal nocturnal dyspnea and no weakness. He tried nitroglycerin and aspirin for the symptoms.  His past medical history is significant for CAD, diabetes and hypertension. Procedure history comments: s/p CABG.    Chest Pain  The current episode started 3 - 5 days ago (3). The problem occurs constantly. The problem has been unchanged. The quality of the pain is described as burning and pressure-like. The pain does not radiate. Associated symptoms include nausea and shortness of breath. Pertinent negatives include no cough, dizziness, fever, lower extremity edema, near-syncope, orthopnea, palpitations, syncope, vomiting or weakness. The pain is aggravated by exertion. He has tried nitroglycerin and aspirin for the symptoms.  His past medical history is significant for CAD, diabetes and hypertension.    Past Medical History  Diagnosis  Date  . Diabetes mellitus   . Hypertension   . Stroke   . Angina     Past Surgical History  Procedure Date  . Coronary artery bypass graft 03/01/2011    Procedure: CORONARY ARTERY BYPASS GRAFTING (CABG);  Surgeon: Alleen Borne, MD;  Location: [facility];  Service: Open Heart Surgery;  Laterality: N/A;  Coronary artery bypass graft times five on pump using left internal mammary artery and right greater saphenous vein via endovein harvest.    History reviewed. No pertinent family history.  History  Substance Use Topics  . Smoking status: Current Everyday Smoker -- 0.5 packs/day    Types: Cigarettes  . Smokeless tobacco: Never Used  . Alcohol Use: No      Review of Systems  Constitutional: Positive for fatigue. Negative for fever.  HENT: Negative.   Eyes: Negative.   Respiratory: Positive for shortness of breath. Negative for cough and wheezing.   Cardiovascular: Positive for chest pain. Negative for palpitations, orthopnea, leg swelling, syncope and near-syncope.  Gastrointestinal: Positive for nausea. Negative for vomiting, diarrhea and blood in stool.  Genitourinary: Negative.   Musculoskeletal: Negative.   Neurological: Negative for dizziness, syncope, weakness and light-headedness.  All other systems reviewed and are negative.    Allergies  Review of patient's allergies indicates no known allergies.  Home Medications   No current outpatient prescriptions on file.  BP 141/71  Pulse 69  Temp 97.5 F (36.4 C) (Oral)  Resp 14  Ht 6' (1.829 m)  Wt 175 lb 14.8 oz (79.8 kg)  BMI 23.86 kg/m2  SpO2 100%  Physical Exam  Nursing note and vitals reviewed. Constitutional:  He is oriented to person, place, and time. He appears well-developed and well-nourished. No distress.  HENT:  Head: Normocephalic and atraumatic.  Eyes: Conjunctivae are normal.  Neck: Neck supple.  Cardiovascular: Normal rate, regular rhythm, normal heart sounds and intact distal pulses.     Pulmonary/Chest: Effort normal and breath sounds normal. He has no wheezes. He has no rales.  Abdominal: Soft. He exhibits no distension. There is no tenderness.  Musculoskeletal: Normal range of motion. He exhibits no edema and no tenderness.  Neurological: He is alert and oriented to person, place, and time.  Skin: Skin is warm and dry.    ED Course  Procedures (including critical care time)  Labs Reviewed  BASIC METABOLIC PANEL - Abnormal; Notable for the following:    Glucose, Bld 135 (*)     All other components within normal limits  PRO B NATRIURETIC PEPTIDE - Abnormal; Notable for the following:    Pro B Natriuretic peptide (BNP) 1886.0 (*)     All other components within normal limits  POCT I-STAT TROPONIN I - Abnormal; Notable for the following:    Troponin i, poc 6.18 (*)     All other components within normal limits  CARDIAC PANEL(CRET KIN+CKTOT+MB+TROPI) - Abnormal; Notable for the following:    Total CK 1241 (*)     CK, MB 88.6 (*)     Troponin I >20.00 (*)     Relative Index 7.1 (*)     All other components within normal limits  PROTIME-INR - Abnormal; Notable for the following:    Prothrombin Time 23.0 (*)     INR 2.00 (*)     All other components within normal limits  GLUCOSE, CAPILLARY - Abnormal; Notable for the following:    Glucose-Capillary 103 (*)     All other components within normal limits  CBC  PROTIME-INR  MRSA PCR SCREENING  CARDIAC PANEL(CRET KIN+CKTOT+MB+TROPI)  CARDIAC PANEL(CRET KIN+CKTOT+MB+TROPI)  BASIC METABOLIC PANEL  HEMOGLOBIN A1C  CBC  LIPID PANEL   Dg Chest 2 View  08/26/2011  *RADIOLOGY REPORT*  Clinical Data: Chest and back pain  CHEST - 2 VIEW  Comparison: 03/28/2011  Findings: The heart size and mediastinal contours are within normal limits.  Both lungs are clear.  The visualized skeletal structures are unremarkable.  IMPRESSION: Negative exam.  Original Report Authenticated By: Rosealee Albee, M.D.     1.  NSTEMI    MDM  71 yo male with PMHx of CAD s/p CABG, HTN, DM presents with 3 day history of substernal and epigastric chest pain described as "burning".  Exacerbated by exertion and associated sx of shortness of breath.  AF, VSS, NAD at presentation.  EKG w/o ST or T wave changes.  Troponin elevated at 6.18 and BNP 1886.  Pt given ASA and nitro.  Pain from 8/10 to 5/10 after 1 SL nitro.  Will consult Cardiology.   Pt discussed with cardiology.  Will start Heparin and Nitro gtt.  Cardiology to see in ED.    Pt admitted to the care of cardiology instable condition.        Cherre Robins, MD 08/26/11 2300

## 2011-08-26 NOTE — CV Procedure (Signed)
PROCEDURE:  Left heart catheterization with selective coronary angiography, left ventriculogram.  Arterial conduit angiography, vein graft angiography.  PCI of the native circumflex.  INDICATIONS:  Non-STEMI, refractory chest pain  The risks, benefits, and details of the procedure were explained to the patient.  The patient verbalized understanding and wanted to proceed.  Informed written consent was obtained.  PROCEDURE TECHNIQUE:  After Xylocaine anesthesia a 68F sheath was placed in the right femoral artery with a single anterior needle wall stick.   Left coronary angiography was done using a Judkins L4 guide catheter.  Right coronary angiography was done using a Judkins R4 guide catheter.  Left ventriculography was done using a pigtail catheter.  An IMA catheter was used to engage the LIMA.  An LCB catheter was used to engage the SVG to diagonal.  The JR 4 catheter was used to engage the SVG to RCA and the SVG to OM.  An Angio-Seal was deployed for hemostasis in the right groin.   CONTRAST:  Total of 175 cc.  COMPLICATIONS:  None.    HEMODYNAMICS:  Aortic pressure was 115/69, mean 99 mm Hg; LV pressure was 111/0; LVEDP 7.  There was no gradient between the left ventricle and aorta.    ANGIOGRAPHIC DATA:   The left main coronary artery is widely patent..  The left anterior descending artery is a large vessel.  There is moderate proximal disease.  There is competitive flow from the left internal mammary graft to the distal LAD.  The left circumflex artery is a large vessel.  There is a severe, 95% lesion in the mid circumflex.  Past this lesion, there is sequential 70% stenoses.  There several small obtuse marginals originate from the proximal vessel which are patent.  There is a medium-sized OM 4.  The OM 5 is moderately diseased diffusely.  The vein graft attached to the OM can be seen to fill retrograde.  The right coronary artery is a large dominant vessel with a shepherd's crook.  There is  mild disease in the proximal to midportion.  The PDA has an ostial 50% stenosis.  The posterior lateral artery is a large and has severe distal diffuse disease.  The SVG to RCA is occluded.  The SVG to diagonal is a small graft but appears patent.  The SVG to OM is occluded proximally.  The LIMA to LAD appears patent.  LEFT VENTRICULOGRAM:  Left ventricular angiogram was done in the 30 RAO projection and revealed normal left ventricular wall motion and systolic function with an estimated ejection fraction of 55 %.  LVEDP was 7 mmHg.  PCI NARRATIVE:  A CLS 3.5 guiding catheters used.  Angiomax used for anticoagulation.  An ACT was used to confirm that the Angiomax was therapeutic.  A pro-water wire was placed across the area of disease in the circumflex.  A 2.5 x 20 balloon was used to predilate the entire diseased area with several inflations.  A 2.5 x 32 Promus elements stent was advanced to the distal area of disease in the distal circumflex and deployed.  A 2.75 x 20 Promus elements stent was then deployed overlapping the proximal edge of the stent.  The entire stented area was postdilated with a 3.25 x 20 noncompliant balloon.  There is an excellent angiographic result.  Intra-coronary nitroglycerin was used to treat vasospasm in the OM5 vessel caused by wire.  IMPRESSIONS:  1. Culprit for today's presentation was likely acute occlusion of the SVG to OM.  The  native circumflex was stented with 2 overlapping drug-eluting stents, 2.5 x 32 Promus and 2.75 x 20 Promus, postdilated to 3.3 mm in diameter.   2. Normal left ventricular systolic function.  LVEDP 7 mmHg.  Ejection fraction 55 %.       3.   Occluded SVG to RCA.  Occluded SVG to OM.  Patent LIMA to LAD.  Patent SVG to diagonal.  RECOMMENDATION:  Continue dual antiplatelet therapy for at least a year.  The patient was started on Brilinta.  Continue aggressive secondary prevention.  He has a history of stroke, but no mention is made of  intracerebral hemorrhage.  Therefore, Brilinta should be tolerable.  Watch the patient overnight.  Possible discharge tomorrow depending on how he is doing.

## 2011-08-27 ENCOUNTER — Encounter (HOSPITAL_COMMUNITY): Payer: Self-pay | Admitting: *Deleted

## 2011-08-27 DIAGNOSIS — I214 Non-ST elevation (NSTEMI) myocardial infarction: Secondary | ICD-10-CM | POA: Diagnosis present

## 2011-08-27 LAB — BASIC METABOLIC PANEL
GFR calc Af Amer: >90 mL/min (ref >90)
GFR calc non Af Amer: >90 mL/min (ref >90)
Potassium: 3.9 mEq/L (ref 3.5–5.1)
Sodium: 138 mEq/L (ref 135–145)

## 2011-08-27 LAB — GLUCOSE, CAPILLARY: Glucose-Capillary: 156 mg/dL — ABNORMAL HIGH (ref 70–99)

## 2011-08-27 LAB — HEMOGLOBIN A1C: Mean Plasma Glucose: 126 mg/dL — ABNORMAL HIGH (ref <117)

## 2011-08-27 LAB — LIPID PANEL
LDL Cholesterol: 75 mg/dL (ref 0–99)
Total CHOL/HDL Ratio: 3.5 RATIO
VLDL: 21 mg/dL (ref 0–40)

## 2011-08-27 LAB — CBC
Hemoglobin: 13.4 g/dL (ref 13.0–17.0)
MCH: 27.6 pg (ref 26.0–34.0)
MCV: 88.2 fL (ref 78.0–100.0)
RBC: 4.85 MIL/uL (ref 4.22–5.81)

## 2011-08-27 LAB — CARDIAC PANEL(CRET KIN+CKTOT+MB+TROPI): Relative Index: 5.7 — ABNORMAL HIGH (ref 0.0–2.5)

## 2011-08-27 MED ORDER — ACETAMINOPHEN 325 MG PO TABS: 325.0000 mg | ORAL_TABLET | Freq: Four times a day (QID) | ORAL | Status: DC | PRN

## 2011-08-27 NOTE — Progress Notes (Signed)
Pts. Cardiac enzymes was called by lab for critical result troponin >20, pt. With some discomfort while lying in the bed for along time especially back. Had 5 beat run of v-tach non-sustained. Dr. Terressa Koyanagi made aware with orders to cont. titrate the NGT drip. Will cont. to monitor.

## 2011-08-27 NOTE — Progress Notes (Signed)
CARDIAC REHAB PHASE I   PRE:  Rate/Rhythm: 69 SR  BP:  Supine: 130/78  Sitting:   Standing:    SaO2: 98 RA  MODE:  Ambulation: 350 ft   POST:  Rate/Rhythem: 86 SR  BP:  Supine:   Sitting: 130/74  Standing:    SaO2:   Joel Santiago, Joel Santiago  Order received. Pt ambulated 350 ft assist x 1. No complaints or symptoms reported, tolerated well. Slow pace. Returned to bed. VSS. Discharge education: MI, PCI, stent book/card, signs & symptoms, NTG use, diet, home exercise, and phase II cardiac rehab. Pt interested in phase II cardiac rehab. Will send referral and follow up. Electronically signed by Harriett Sine MS on Saturday August 27 2011 at 1416

## 2011-08-27 NOTE — ED Provider Notes (Signed)
I saw and evaluated the patient, reviewed the resident's note and I agree with the findings and plan.   Loren Racer, MD 08/27/11 [phone]

## 2011-08-27 NOTE — Progress Notes (Signed)
Subjective:  Feels better today.  Had acute occlusion of SVG to OM yesterday.  C/o tickling in chest but no chest pain.  No SOB.  Sig elevation of MB and trop.  Objective:  Vital Signs in the last 24 hours: BP 118/71  Pulse 71  Temp 98.6 F (37 C) (Oral)  Resp 12  Ht 6' (1.829 m)  Wt 79.8 kg (175 lb 14.8 oz)  BMI 23.86 kg/m2  SpO2 98%  Physical Exam: Pleasant BM in NAD Lungs:  Clear  Cardiac:  Regular rhythm, normal S1 and S2, no S3 Abdomen:  Soft, nontender, no masses Extremities:  Cath site clean and dry  Intake/Output from previous day: 08/02 0701 - 08/03 0700 In: 391 [P.O.:360; I.V.:31] Out: 1600 [Urine:1600]  Weight Filed Weights   08/26/11 1806  Weight: 79.8 kg (175 lb 14.8 oz)    Lab Results: Basic Metabolic Panel:  Basename 08/26/11 1430  NA 138  K 4.1  CL 100  CO2 24  GLUCOSE 135*  BUN 11  CREATININE 0.69   CBC:  Basename 08/27/11 0551 08/26/11 1430  WBC 7.2 8.3  NEUTROABS -- --  HGB 13.4 14.9  HCT 42.8 45.8  MCV 88.2 87.4  PLT 183 176   Cardiac Enzymes:  Basename 08/27/11 0551 08/26/11 2106  CKTOTAL 1084* 1241*  CKMB 61.6* 88.6*  CKMBINDEX -- --  TROPONINI 12.19* >20.00*    Telemetry: Sinus rhythm.  Had 5 beats of SVT last night.  Assessment/Plan:  1. Recent Lateral MI due to vein graft occlusion 2. CAD with prior CABG 3. Diabetes  Rec:  To go to floor today.  With increased MB and Trop, will need to stay today.  If stable d/c in am.      W. Ashley Royalty  MD [facility]  08/27/2011, 8:38 AM

## 2011-08-28 DIAGNOSIS — E1159 Type 2 diabetes mellitus with other circulatory complications: Secondary | ICD-10-CM | POA: Diagnosis present

## 2011-08-28 DIAGNOSIS — I119 Hypertensive heart disease without heart failure: Secondary | ICD-10-CM | POA: Diagnosis present

## 2011-08-28 DIAGNOSIS — E785 Hyperlipidemia, unspecified: Secondary | ICD-10-CM | POA: Diagnosis present

## 2011-08-28 DIAGNOSIS — I2510 Atherosclerotic heart disease of native coronary artery without angina pectoris: Secondary | ICD-10-CM

## 2011-08-28 MED ORDER — TICAGRELOR 90 MG PO TABS: 90.0000 mg | ORAL_TABLET | Freq: Two times a day (BID) | ORAL | Status: AC

## 2011-08-28 MED ORDER — NITROGLYCERIN 0.4 MG SL SUBL: 0.4000 mg | SUBLINGUAL_TABLET | SUBLINGUAL | Status: DC | PRN

## 2011-08-28 NOTE — Progress Notes (Signed)
Discharge review done with patient.   Patient acknowledged understanding of information provided.  Prescriptions given per MD's order.  Patient is stable and discharged home with wife and son. Joel Santiago

## 2011-08-28 NOTE — Discharge Summary (Signed)
Physician Discharge Summary  Patient ID: Joel Santiago MRN: [id number] DOB/AGE: [birth year] 71 y.o.  Admit date: 08/26/2011 Discharge date: 08/28/2011  Primary Discharge Diagnosis: 1. Non-ST elevation myocardial infarction-initial episode  Secondary Discharge Diagnosis: 2. Three-vessel coronary artery disease with recent bypass grafting 3. Interval occlusion of circumflex and right coronary artery grafts with stenting of the native circumflex coronary artery drug-eluting stents 4. Type 2 diabetes mellitus with vascular disease 5. Hypertension 6. Hyperlipidemia 7. Tobacco abuse  Procedures: Cardiac catheterization with placement of drug-eluting stents into the circumflex coronary artery  Hospital Course: This 71 year old male had bypass grafting her three-vessel disease by Dr. Laneta Simmers in February. He had been doing well but presented with worsening chest discomfort for 3 days prior to admission and was found to have elevated cardiac enzymes in the emergency room. He was continued on heparin and nitroglycerin and because of that failure to relieve his pain with intravenous heparin nitroglycerin was taken to the cardiac catheterization laboratory. He was found to have occluded his vein grafts the right coronary artery and circumflex. The vein graft to diagonal was patent in the mammary graft was patent. Baranowski placed overlapping Promus stents in the distal circumflex and post dilated to 3.25 mm. Cardiac enzymes were elevated as noted below. He had an inferior infarction on EKG. He was transferred to the floor and had no recurrent chest discomfort and was seen by cardiac rehabilitation. The importance of smoking cessation was discussed with him. He was placed on Brilanta and the importance of taking antiplatelet therapy was discussed with the patient. He is discharged today in improved condition.  Discharge Exam: Blood pressure 120/69, pulse 82, temperature 98.5 F (36.9 C), temperature source  Oral, resp. rate 17, height 6' (1.829 m), weight 79.8 kg (175 lb 14.8 oz), SpO2 98.00%.   Lungs clear, catheterization site clean and dry  Labs: CBC:   Lab Results  Component Value Date   WBC 7.2 08/27/2011   HGB 13.4 08/27/2011   HCT 42.8 08/27/2011   MCV 88.2 08/27/2011   PLT 183 08/27/2011   CMP:  Lab 08/27/11 0551  NA 138  K 3.9  CL 100  CO2 22  BUN 8  CREATININE 0.73  CALCIUM 8.8  PROT --  BILITOT --  ALKPHOS --  ALT --  AST --  GLUCOSE 143*   Lipid Panel     Component Value Date/Time   CHOL 134 08/27/2011 0551   TRIG 107 08/27/2011 0551   HDL 38* 08/27/2011 0551   CHOLHDL 3.5 08/27/2011 0551   VLDL 21 08/27/2011 0551   LDLCALC 75 08/27/2011 0551   Cardiac Enzymes:  Basename 08/27/11 0551 08/26/11 2106  CKTOTAL 1084* 1241*  CKMB 61.6* 88.6*  CKMBINDEX -- --  TROPONINI 12.19* >20.00*    Radiology: Chest x-ray: Normal  EKG: Inferior infarction age undetermined, sinus rhythm, nonspecific ST changes  Discharge Medications:  Joel Santiago, Joel Santiago  Home Medication Instructions QMV:[id number]   Printed on:08/28/11 1023  Medication Information                    metFORMIN (GLUCOPHAGE) 1000 MG tablet Take 1,000 mg by mouth 2 (two) times daily with a meal.            aspirin 81 MG tablet Take 81 mg by mouth daily.           fish oil-omega-3 fatty acids 1000 MG capsule Take 1 capsule by mouth daily.            metoprolol (  LOPRESSOR) 50 MG tablet Take 25 mg by mouth 2 (two) times daily.           atorvastatin (LIPITOR) 80 MG tablet Take 80 mg by mouth at bedtime.           acetaminophen (TYLENOL) 500 MG tablet Take 1,000 mg by mouth every 6 (six) hours as needed. For pain           nitroGLYCERIN (NITROSTAT) 0.4 MG SL tablet Place 1 tablet (0.4 mg total) under the tongue every 5 (five) minutes x 3 doses as needed for chest pain.           Ticagrelor (BRILINTA) 90 MG TABS tablet Take 1 tablet (90 mg total) by mouth 2 (two) times daily.             Followup plans and  appointments: Call Dr. Mayford Knife to get appointment for followup in one week    Time spent with patient to include physician time: 40 minutes  Signed: W. Ashley Royalty. MD [facility] 08/28/2011, 10:23 AM

## 2011-08-29 LAB — POCT ACTIVATED CLOTTING TIME: Activated Clotting Time: 319 seconds

## 2011-08-29 MED FILL — Dextrose Inj 5%: INTRAVENOUS | Qty: 50 | Status: AC

## 2011-11-28 ENCOUNTER — Encounter: Payer: Medicare Other | Admitting: Medical

## 2011-12-24 ENCOUNTER — Inpatient Hospital Stay (HOSPITAL_COMMUNITY): Payer: Medicare Other

## 2011-12-24 ENCOUNTER — Encounter (HOSPITAL_COMMUNITY): Payer: Self-pay | Admitting: *Deleted

## 2011-12-24 ENCOUNTER — Emergency Department (HOSPITAL_COMMUNITY): Payer: Medicare Other

## 2011-12-24 ENCOUNTER — Inpatient Hospital Stay (HOSPITAL_COMMUNITY)
Admission: EM | Admit: 2011-12-24 | Discharge: 2011-12-26 | DRG: 065 | Disposition: A | Discharge status: Home Health | Payer: Medicare Other | Attending: Internal Medicine | Admitting: Internal Medicine

## 2011-12-24 DIAGNOSIS — Z8249 Family history of ischemic heart disease and other diseases of the circulatory system: Secondary | ICD-10-CM

## 2011-12-24 DIAGNOSIS — I059 Rheumatic mitral valve disease, unspecified: Secondary | ICD-10-CM

## 2011-12-24 DIAGNOSIS — I214 Non-ST elevation (NSTEMI) myocardial infarction: Secondary | ICD-10-CM

## 2011-12-24 DIAGNOSIS — I798 Other disorders of arteries, arterioles and capillaries in diseases classified elsewhere: Secondary | ICD-10-CM

## 2011-12-24 DIAGNOSIS — F17200 Nicotine dependence, unspecified, uncomplicated: Secondary | ICD-10-CM | POA: Diagnosis present

## 2011-12-24 DIAGNOSIS — I252 Old myocardial infarction: Secondary | ICD-10-CM

## 2011-12-24 DIAGNOSIS — E785 Hyperlipidemia, unspecified: Secondary | ICD-10-CM | POA: Diagnosis present

## 2011-12-24 DIAGNOSIS — Z8673 Personal history of transient ischemic attack (TIA), and cerebral infarction without residual deficits: Secondary | ICD-10-CM

## 2011-12-24 DIAGNOSIS — I6350 Cerebral infarction due to unspecified occlusion or stenosis of unspecified cerebral artery: Principal | ICD-10-CM

## 2011-12-24 DIAGNOSIS — E1159 Type 2 diabetes mellitus with other circulatory complications: Secondary | ICD-10-CM | POA: Diagnosis present

## 2011-12-24 DIAGNOSIS — I639 Cerebral infarction, unspecified: Secondary | ICD-10-CM | POA: Diagnosis present

## 2011-12-24 DIAGNOSIS — I2510 Atherosclerotic heart disease of native coronary artery without angina pectoris: Secondary | ICD-10-CM | POA: Diagnosis present

## 2011-12-24 DIAGNOSIS — I63539 Cerebral infarction due to unspecified occlusion or stenosis of unspecified posterior cerebral artery: Secondary | ICD-10-CM

## 2011-12-24 DIAGNOSIS — G8190 Hemiplegia, unspecified affecting unspecified side: Secondary | ICD-10-CM | POA: Diagnosis present

## 2011-12-24 DIAGNOSIS — I119 Hypertensive heart disease without heart failure: Secondary | ICD-10-CM | POA: Diagnosis present

## 2011-12-24 DIAGNOSIS — Z951 Presence of aortocoronary bypass graft: Secondary | ICD-10-CM

## 2011-12-24 HISTORY — DX: Hyperlipidemia, unspecified: E78.5

## 2011-12-24 HISTORY — DX: Angina pectoris, unspecified: I20.9

## 2011-12-24 HISTORY — DX: Atherosclerotic heart disease of native coronary artery without angina pectoris: I25.10

## 2011-12-24 LAB — DIFFERENTIAL
Basophils Absolute: 0 10*3/uL (ref 0.0–0.1)
Basophils Relative: 0 % (ref 0–1)
Eosinophils Relative: 1 % (ref 0–5)
Monocytes Absolute: 1 10*3/uL (ref 0.1–1.0)
Neutro Abs: 3 10*3/uL (ref 1.7–7.7)

## 2011-12-24 LAB — APTT: aPTT: 30 seconds (ref 24–37)

## 2011-12-24 LAB — COMPREHENSIVE METABOLIC PANEL
AST: 23 U/L (ref 0–37)
Albumin: 3.9 g/dL (ref 3.5–5.2)
Calcium: 8.9 mg/dL (ref 8.4–10.5)
Chloride: 101 mEq/L (ref 96–112)
Creatinine, Ser: 0.65 mg/dL (ref 0.50–1.35)
Total Protein: 7.7 g/dL (ref 6.0–8.3)

## 2011-12-24 LAB — POCT I-STAT TROPONIN I

## 2011-12-24 LAB — GLUCOSE, CAPILLARY: Glucose-Capillary: 183 mg/dL — ABNORMAL HIGH (ref 70–99)

## 2011-12-24 LAB — POCT I-STAT, CHEM 8
BUN: 7 mg/dL (ref 6–23)
Creatinine, Ser: 0.8 mg/dL (ref 0.50–1.35)
Potassium: 3.6 mEq/L (ref 3.5–5.1)
Sodium: 141 mEq/L (ref 135–145)

## 2011-12-24 LAB — TROPONIN I
Troponin I: <0.3 ng/mL (ref <0.30)
Troponin I: <0.3 ng/mL (ref <0.30)

## 2011-12-24 LAB — CBC
HCT: 41.4 % (ref 39.0–52.0)
MCHC: 31.9 g/dL (ref 30.0–36.0)
RDW: 13.4 % (ref 11.5–15.5)

## 2011-12-24 MED ORDER — ASPIRIN EC 81 MG PO TBEC
81.0000 mg | DELAYED_RELEASE_TABLET | Freq: Every day | ORAL | Status: DC
  Administered 2011-12-24 – 2011-12-26 (×3): 81 mg via ORAL
  Filled 2011-12-24 (×3): qty 1

## 2011-12-24 MED ORDER — ALTEPLASE (STROKE) FULL DOSE INFUSION
0.9000 mg/kg | Freq: Once | INTRAVENOUS | Status: DC
  Filled 2011-12-24: qty 80

## 2011-12-24 MED ORDER — ASPIRIN 81 MG PO TABS: 81.0000 mg | ORAL_TABLET | Freq: Every day | ORAL | Status: DC

## 2011-12-24 MED ORDER — HEPARIN SODIUM (PORCINE) 5000 UNIT/ML IJ SOLN
5000.0000 [IU] | Freq: Three times a day (TID) | INTRAMUSCULAR | Status: DC
  Administered 2011-12-24 – 2011-12-26 (×7): 5000 [IU] via SUBCUTANEOUS
  Filled 2011-12-24 (×10): qty 1

## 2011-12-24 MED ORDER — SODIUM CHLORIDE 0.9 % IV SOLN
INTRAVENOUS | Status: DC
  Administered 2011-12-24: 14:00:00 via INTRAVENOUS

## 2011-12-24 MED ORDER — OMEGA-3 FATTY ACIDS 1000 MG PO CAPS: 1.0000 | ORAL_CAPSULE | Freq: Two times a day (BID) | ORAL | Status: DC

## 2011-12-24 MED ORDER — ATORVASTATIN CALCIUM 80 MG PO TABS
80.0000 mg | ORAL_TABLET | Freq: Every day | ORAL | Status: DC
  Administered 2011-12-24 – 2011-12-25 (×2): 80 mg via ORAL
  Filled 2011-12-24 (×3): qty 1

## 2011-12-24 MED ORDER — OMEGA-3 FATTY ACIDS 1000 MG PO CAPS: 1.0000 | ORAL_CAPSULE | Freq: Every day | ORAL | Status: DC

## 2011-12-24 MED ORDER — METOPROLOL TARTRATE 25 MG PO TABS
25.0000 mg | ORAL_TABLET | Freq: Two times a day (BID) | ORAL | Status: DC
  Administered 2011-12-24 – 2011-12-26 (×5): 25 mg via ORAL
  Filled 2011-12-24 (×6): qty 1

## 2011-12-24 MED ORDER — ONDANSETRON HCL 4 MG/2ML IJ SOLN
4.0000 mg | Freq: Four times a day (QID) | INTRAMUSCULAR | Status: DC | PRN
  Administered 2011-12-24 (×2): 4 mg via INTRAVENOUS
  Filled 2011-12-24 (×2): qty 2

## 2011-12-24 MED ORDER — NITROGLYCERIN 0.4 MG SL SUBL: 0.4000 mg | SUBLINGUAL_TABLET | SUBLINGUAL | Status: DC | PRN

## 2011-12-24 MED ORDER — ACETAMINOPHEN 500 MG PO TABS
1000.0000 mg | ORAL_TABLET | Freq: Four times a day (QID) | ORAL | Status: DC | PRN
  Administered 2011-12-24: 1000 mg via ORAL
  Filled 2011-12-24: qty 2

## 2011-12-24 MED ORDER — TICAGRELOR 90 MG PO TABS
90.0000 mg | ORAL_TABLET | Freq: Two times a day (BID) | ORAL | Status: DC
  Administered 2011-12-24 – 2011-12-26 (×5): 90 mg via ORAL
  Filled 2011-12-24 (×6): qty 1

## 2011-12-24 MED ORDER — INSULIN ASPART 100 UNIT/ML ~~LOC~~ SOLN
0.0000 [IU] | Freq: Three times a day (TID) | SUBCUTANEOUS | Status: DC
  Administered 2011-12-24: 5 [IU] via SUBCUTANEOUS
  Administered 2011-12-25: 2 [IU] via SUBCUTANEOUS
  Administered 2011-12-25: 1 [IU] via SUBCUTANEOUS
  Administered 2011-12-25: 2 [IU] via SUBCUTANEOUS
  Administered 2011-12-26: 5 [IU] via SUBCUTANEOUS
  Administered 2011-12-26: 3 [IU] via SUBCUTANEOUS
  Administered 2011-12-26: 2 [IU] via SUBCUTANEOUS

## 2011-12-24 NOTE — ED Notes (Signed)
Last seen normal 0800. Woke up at 730 with no neuro deficits. Hx. Of x 2 strokes. Residual rt. Side weakness. Inconsistent with symptoms - states, No weakness and/or normal. Pt. States, "sudden dizziness when sitting up."

## 2011-12-24 NOTE — Code Documentation (Signed)
Code stroke called at [phone], patient arrived to [facility] via EMS at 0950, EDP seen at [phone], stroke team arrived at [phone], Patient arrived in CT at 59, Lab at 19.  NIHSS 3. As per patient, LSN 0800 he developed Nausea, headache, dizziness and trouble with gait.  He has a hx of CVA with slight deficits.  Patient to MRI at 1016.

## 2011-12-24 NOTE — Progress Notes (Signed)
Pt returned from x-ray with nausea.  Gave zofran at 1655 and pt still remained nauseous and vomited x 1.  Offered pt ginger ale and ice chips.  Pt states he feels better after vomiting.  Will continue to monitor.

## 2011-12-24 NOTE — ED Provider Notes (Addendum)
History     CSN: [id number]  Arrival date & time 12/24/11  4098   First MD Initiated Contact with Patient 12/24/11 1004      No chief complaint on file.   (Consider location/radiation/quality/duration/timing/severity/associated sxs/prior treatment) HPI Comments: Patient with pmh of DM, HTN, CVA.  Woke this morning feeling well, then developed dizziness and instability with his gait.  He denies any weakness of his arms or legs.  Denies headache.  Blood sugar was okay by ems.  The history is provided by the patient.    Past Medical History  Diagnosis Date  . Diabetes mellitus   . Hypertension   . Stroke   . Angina     Past Surgical History  Procedure Date  . Coronary artery bypass graft 03/01/2011    Procedure: CORONARY ARTERY BYPASS GRAFTING (CABG);  Surgeon: Alleen Borne, MD;  Location: [facility] OR;  Service: Open Heart Surgery;  Laterality: N/A;  Coronary artery bypass graft times five on pump using left internal mammary artery and right greater saphenous vein via endovein harvest.    No family history on file.  History  Substance Use Topics  . Smoking status: Current Every Day Smoker -- 0.5 packs/day    Types: Cigarettes  . Smokeless tobacco: Never Used  . Alcohol Use: No      Review of Systems  All other systems reviewed and are negative.    Allergies  Review of patient's allergies indicates no known allergies.  Home Medications   Current Outpatient Rx  Name  Route  Sig  Dispense  Refill  . ACETAMINOPHEN 500 MG PO TABS   Oral   Take 1,000 mg by mouth every 6 (six) hours as needed. For pain         . ASPIRIN 81 MG PO TABS   Oral   Take 81 mg by mouth daily.         . ATORVASTATIN CALCIUM 80 MG PO TABS   Oral   Take 80 mg by mouth at bedtime.         . OMEGA-3 FATTY ACIDS 1000 MG PO CAPS   Oral   Take 1 capsule by mouth daily.          Marland Kitchen METFORMIN HCL 1000 MG PO TABS   Oral   Take 1,000 mg by mouth 2 (two) times daily with a meal.         . METOPROLOL TARTRATE 50 MG PO TABS   Oral   Take 25 mg by mouth 2 (two) times daily.         Marland Kitchen NITROGLYCERIN 0.4 MG SL SUBL   Sublingual   Place 1 tablet (0.4 mg total) under the tongue every 5 (five) minutes x 3 doses as needed for chest pain.   25 tablet   12   . TICAGRELOR 90 MG PO TABS   Oral   Take 1 tablet (90 mg total) by mouth 2 (two) times daily.   60 tablet   12     There were no vitals taken for this visit.  Physical Exam  Nursing note and vitals reviewed. Constitutional: He is oriented to person, place, and time. He appears well-developed and well-nourished. No distress.  HENT:  Head: Normocephalic and atraumatic.  Mouth/Throat: Oropharynx is clear and moist.  Eyes: EOM are normal. Pupils are equal, round, and reactive to light.  Neck: Normal range of motion. Neck supple.  Cardiovascular: Normal rate and regular rhythm.   No murmur heard.  Pulmonary/Chest: Effort normal and breath sounds normal. No respiratory distress.  Abdominal: Soft. Bowel sounds are normal.  Musculoskeletal: Normal range of motion. He exhibits no edema.  Neurological: He is alert and oriented to person, place, and time. No cranial nerve deficit. He exhibits normal muscle tone. Coordination normal.  Skin: Skin is warm and dry. He is not diaphoretic.    ED Course  Procedures (including critical care time)   Labs Reviewed  PROTIME-INR  APTT  CBC  DIFFERENTIAL  COMPREHENSIVE METABOLIC PANEL  TROPONIN I   No results found.   No diagnosis found.   Date: 12/24/2011  Rate: 92  Rhythm: normal sinus rhythm  QRS Axis: left  Intervals: normal  ST/T Wave abnormalities: nonspecific T wave changes  Conduction Disutrbances:none  Narrative Interpretation:   Old EKG Reviewed: unchanged    MDM  The patient arrived as a Code Stroke.  He was exhibiting deficits within an appropriate time period for potential intervention, thus a code stroke was continued.  The ct was okay, then  was seen by Dr. Amada Jupiter who then wanted an mri.  This showed potential cva.  Neurology requests admission to medicine.     CRITICAL CARE Performed by: Geoffery Lyons   Total critical care time: 30 minutes  Critical care time was exclusive of separately billable procedures and treating other patients.  Critical care was necessary to treat or prevent imminent or life-threatening deterioration.  Critical care was time spent personally by me on the following activities: development of treatment plan with patient and/or surrogate as well as nursing, discussions with consultants, evaluation of patient's response to treatment, examination of patient, obtaining history from patient or surrogate, ordering and performing treatments and interventions, ordering and review of laboratory studies, ordering and review of radiographic studies, pulse oximetry and re-evaluation of patient's condition.       Geoffery Lyons, MD 12/24/11 1150  Geoffery Lyons, MD 12/24/11 1151

## 2011-12-24 NOTE — Evaluation (Signed)
Physical Therapy Evaluation Patient Details Name: Joel Santiago MRN: [id number] DOB: May 05, 1940 Today's Date: 12/24/2011 Time: 4098-1191 PT Time Calculation (min): 21 min  PT Assessment / Plan / Recommendation Clinical Impression  Pt. admitted with blurred vision and unsteadiness with dizziness and nausea, with increased right sided weakness since admission. MRI found a probable small area acute infarct in the floor of the fourth ventricle on the left and a possible small acute infarct in the left middle cerebellar peduncle. Evaluation limited secondary to high BP and dizziness upon sitting, will continue evalation in further treatments.     PT Assessment  Patient needs continued PT services    Follow Up Recommendations  Other (comment) (TBD )    Does the patient have the potential to tolerate intense rehabilitation      Barriers to Discharge        Equipment Recommendations  Other (comment) (TBD)    Recommendations for Other Services     Frequency Min 4X/week    Precautions / Restrictions Precautions Precautions: Fall Restrictions Weight Bearing Restrictions: No   Pertinent Vitals/Pain No pain. BP 178/77 on sitting. RN aware, pt back in supine.       Mobility  Bed Mobility Bed Mobility: Supine to Sit;Sitting - Scoot to Edge of Bed;Sit to Supine Supine to Sit: 3: Mod assist;With rails;HOB elevated Sitting - Scoot to Edge of Bed: 4: Min assist Sit to Supine: 3: Mod assist;With rail;HOB elevated Details for Bed Mobility Assistance: VC for proper sequencing. Upon sitting, pt complained of dizziness. BP taken 178/77 and pt laid back down Transfers Transfers: Not assessed (high BP) Ambulation/Gait Ambulation/Gait Assistance: Not tested (comment) Modified Rankin (Stroke Patients Only) Pre-Morbid Rankin Score: No symptoms Modified Rankin: Severe disability    Shoulder Instructions     Exercises     PT Diagnosis: Difficulty walking;Generalized weakness  PT Problem  List: Decreased strength;Decreased activity tolerance;Decreased coordination;Decreased mobility;Decreased balance;Decreased knowledge of use of DME;Decreased knowledge of precautions;Decreased safety awareness PT Treatment Interventions: Gait training;DME instruction;Stair training;Functional mobility training;Therapeutic activities;Therapeutic exercise;Balance training;Neuromuscular re-education;Patient/family education   PT Goals Acute Rehab PT Goals PT Goal Formulation: With patient Time For Goal Achievement: 01/07/12 Potential to Achieve Goals: Fair Pt will go Supine/Side to Sit: with modified independence PT Goal: Supine/Side to Sit - Progress: Goal set today Pt will go Sit to Supine/Side: with modified independence PT Goal: Sit to Supine/Side - Progress: Goal set today Pt will go Sit to Stand: with supervision PT Goal: Sit to Stand - Progress: Goal set today Pt will go Stand to Sit: with supervision PT Goal: Stand to Sit - Progress: Goal set today Pt will Transfer Bed to Chair/Chair to Bed: with min assist PT Transfer Goal: Bed to Chair/Chair to Bed - Progress: Goal set today Pt will Ambulate: 16 - 50 feet;with min assist;with least restrictive assistive device PT Goal: Ambulate - Progress: Goal set today  Visit Information  Last PT Received On: 12/24/11 Assistance Needed: +2 PT/OT Co-Evaluation/Treatment: Yes    Subjective Data  Patient Stated Goal: to be back to normal   Prior Functioning  Home Living Lives With: Spouse Available Help at Discharge: Family;Available 24 hours/day Type of Home: Apartment Home Access: Stairs to enter Entrance Stairs-Number of Steps: 1 Entrance Stairs-Rails: None Home Layout: One level Bathroom Shower/Tub: Forensic scientist: Standard Bathroom Accessibility: Yes How Accessible: Accessible via walker Home Adaptive Equipment: Straight cane Prior Function Level of Independence: Independent Able to Take Stairs?:  Yes Driving: Yes Vocation: Retired Musician: No difficulties  Dominant Hand: Right    Cognition  Overall Cognitive Status: Appears within functional limits for tasks assessed/performed Arousal/Alertness: Awake/alert Orientation Level: Appears intact for tasks assessed Behavior During Session: [facility] for tasks performed    Extremity/Trunk Assessment Right Lower Extremity Assessment RLE ROM/Strength/Tone: Deficits RLE ROM/Strength/Tone Deficits: grossly 4/5 RLE Sensation: Deficits RLE Sensation Deficits: decreased light touch throughout RLE Left Lower Extremity Assessment LLE ROM/Strength/Tone: Within functional levels LLE Sensation: WFL - Light Touch   Balance Balance Balance Assessed: Yes Static Sitting Balance Static Sitting - Balance Support: Bilateral upper extremity supported;Feet supported Static Sitting - Level of Assistance: 1: +1 Total assist;4: Min assist Static Sitting - Comment/# of Minutes: Min assist for majority of sitting balance, although required total assist at times secondary to pt with posterior left lateral lean.   End of Session PT - End of Session Activity Tolerance: Treatment limited secondary to medical complications (Comment) Patient left: in bed;with call bell/phone within reach;with family/visitor present Nurse Communication: Mobility status  GP     Milana Kidney 12/24/2011, 3:35 PM  12/24/2011 Milana Kidney DPT PAGER: [phone] OFFICE: [phone]

## 2011-12-24 NOTE — ED Notes (Signed)
Pt transported to MRI prior to vital signs.

## 2011-12-24 NOTE — Progress Notes (Signed)
VASCULAR LAB PRELIMINARY  PRELIMINARY  PRELIMINARY  PRELIMINARY  Carotid duplex completed.    Preliminary report:  Bilateral:  No evidence of hemodynamically significant internal carotid artery stenosis.   Vertebral artery flow is antegrade.     Shaheen Star, RVS 12/24/2011, 4:26 PM

## 2011-12-24 NOTE — ED Notes (Signed)
Family at bedside. 

## 2011-12-24 NOTE — ED Notes (Signed)
MD at bedside. 

## 2011-12-24 NOTE — H&P (Signed)
[facility] History and Physical  Joel Santiago ZOX:[id number] DOB: [birth year] DOA: 12/24/2011  Referring physician: Geoffery Lyons, MD PCP: No primary provider on file.   Chief Complaint: Right-sided weakness  HPI: Joel Santiago is a 71 y.o. 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 Patient woke up this morning about 7 AM feeling okay, about 8 he started to have some nausea and lightheadedness, after that he went to the bathroom and had bowel movement for this is going to help him, this was actually worsened his symptoms. He became very unsteady he felt numbness in his arm about 9 AM. He called 911 and came to the hospital for further evaluation. Code stroke was called upon arrival to the emergency department, CT scan did not show any acute abnormalities. Abbreviated MRI was done showed multifocal stroke involving the posterior circulation. Patient was not given tPA because of his minimal symptoms. Patient is on aspirin and Brilinta. Patient will be admitted to the hospital for further evaluation.   Review of Systems:  Constitutional: Unsteadiness and lightheadedness Eyes: negative for irritation, redness and visual disturbance Ears, nose, mouth, throat, and face: negative for earaches, epistaxis, nasal congestion and sore throat Respiratory: negative for cough, dyspnea on exertion, sputum and wheezing Cardiovascular: negative for chest pain, dyspnea, lower extremity edema, orthopnea, palpitations and syncope Gastrointestinal: negative for abdominal pain, constipation, diarrhea, melena, nausea and vomiting Genitourinary:negative for dysuria, frequency and hematuria Hematologic/lymphatic: negative for bleeding, easy bruising and lymphadenopathy Musculoskeletal:negative for arthralgias, muscle weakness and stiff joints Neurological: Per history of present illness weakness and numbness of right side Endocrine: negative for  diabetic symptoms including polydipsia, polyuria and weight loss Allergic/Immunologic: negative for anaphylaxis, hay fever and urticaria   Past Medical History  Diagnosis Date  . Diabetes mellitus   . Hypertension   . Stroke   . Angina   . Coronary artery disease   . Hyperlipemia   . Cardiac angina    Past Surgical History  Procedure Date  . Coronary artery bypass graft 03/01/2011    Procedure: CORONARY ARTERY BYPASS GRAFTING (CABG);  Surgeon: Alleen Borne, MD;  Location: [facility] OR;  Service: Open Heart Surgery;  Laterality: N/A;  Coronary artery bypass graft times five on pump using left internal mammary artery and right greater saphenous vein via endovein harvest.   Social History:  reports that he has been smoking Cigarettes.  He has been smoking about .5 packs per day. He has never used smokeless tobacco. He reports that he does not drink alcohol or use illicit drugs. Lives at home with his wife, good functional status  No Known Allergies  Family History  Problem Relation Santiago of Onset  . CAD Father   . CAD Mother    Prior to Admission medications   Medication Sig Start Date End Date Taking? Authorizing Provider  acetaminophen (TYLENOL) 500 MG tablet Take 1,000 mg by mouth every 6 (six) hours as needed. For pain   Yes Historical Provider, MD  aspirin 81 MG tablet Take 81 mg by mouth daily.   Yes Historical Provider, MD  atorvastatin (LIPITOR) 80 MG tablet Take 80 mg by mouth at bedtime.   Yes Historical Provider, MD  fish oil-omega-3 fatty acids 1000 MG capsule Take 1 capsule by mouth daily.    Yes Historical Provider, MD  metFORMIN (GLUCOPHAGE) 1000 MG tablet Take 1,000 mg by mouth 2 (two) times daily with a meal.    Yes Historical Provider, MD  metoprolol (LOPRESSOR) 50 MG tablet Take 25 mg by mouth 2 (two) times daily.   Yes Historical Provider, MD  PRESCRIPTION MEDICATION Take 1 tablet by mouth daily. Evacetrapib or placebo. Pt is on drug study.   Yes Historical Provider, MD    Ticagrelor (BRILINTA) 90 MG TABS tablet Take 1 tablet (90 mg total) by mouth 2 (two) times daily. 08/28/11  Yes Othella Boyer, MD  nitroGLYCERIN (NITROSTAT) 0.4 MG SL tablet Place 1 tablet (0.4 mg total) under the tongue every 5 (five) minutes x 3 doses as needed for chest pain. 08/28/11 08/27/12  Othella Boyer, MD   Physical Exam: Filed Vitals:   12/24/11 1050 12/24/11 1100 12/24/11 1115 12/24/11 1130  BP: 170/91 163/140 175/94 159/64  Pulse:  83 39 66  Resp: 22 17 19 16   Weight:      SpO2: 100% 98% 98% 97%    G Aeneral appearance: alert, cooperative and no distress  Head: Normocephalic, without obvious abnormality, atraumatic  Eyes:  there is redness in the lateral side of the left conjunctiva. PERRL, EOM's intact. Fundi benign.  Nose: Nares normal. Septum midline. Mucosa normal. No drainage or sinus tenderness.  Throat: lips, mucosa, and tongue normal; teeth and gums normal  Neck: Supple, no masses, no cervical lymphadenopathy, no JVD appreciated, no meningeal signs Resp: clear to auscultation bilaterally  Chest wall: no tenderness  Cardio: regular rate and rhythm, S1, S2 normal, no murmur, click, rub or gallop  GI: soft, non-tender; bowel sounds normal; no masses, no organomegaly  Extremities: extremities normal, atraumatic, no cyanosis or edema  Skin: Skin color, texture, turgor normal. No rashes or lesions  Neurologic: Alert and oriented X 3, normal strength and tone. Normal symmetric reflexes. Normal coordination and gait  Labs on Admission:  Basic Metabolic Panel:  Lab 12/24/11 7846 12/24/11 0950  NA 141 137  K 3.6 3.6  CL 104 101  CO2 -- 25  GLUCOSE 267* 278*  BUN 7 8  CREATININE 0.80 0.65  CALCIUM -- 8.9  MG -- --  PHOS -- --   Liver Function Tests:  Lab 12/24/11 0950  AST 23  ALT 51  ALKPHOS 69  BILITOT 0.3  PROT 7.7  ALBUMIN 3.9   No results found for this basename: LIPASE:5,AMYLASE:5 in the last 168 hours No results found for this basename:  AMMONIA:5 in the last 168 hours CBC:  Lab 12/24/11 1008 12/24/11 0950  WBC -- 5.6  NEUTROABS -- 3.0  HGB 14.3 13.2  HCT 42.0 41.4  MCV -- 88.1  PLT -- 186   Cardiac Enzymes:  Lab 12/24/11 0954  CKTOTAL --  CKMB --  CKMBINDEX --  TROPONINI <0.30    BNP (last 3 results)  Basename 08/26/11 1430  PROBNP 1886.0*   CBG: No results found for this basename: GLUCAP:5 in the last 168 hours  Radiological Exams on Admission: Ct Head Wo Contrast  12/24/2011  *RADIOLOGY REPORT*  Clinical Data: Code stroke.  Patient with blurred vision, headache, and difficulty ambulating.  CT HEAD WITHOUT CONTRAST  Technique:  Contiguous axial images were obtained from the base of the skull through the vertex without contrast.  Comparison: No priors  Findings: The patient has fairly extensive bilateral chronic microvascular ischemic changes, with patchy areas of hypoattenuation in the periventricular white matter bilaterally, evidence of prior lacunar infarction in the right caudate head on image number 19, and hypoattenuation in the subinsular regions/basal ganglia bilaterally.  There is no evidence of acute cortically based infarction  by CT.   Negative for hemorrhage, hydrocephalus, mass effect, or midline shift.  There is some mucosal thickening of the inferior left frontal sinus and anterior ethmoid air cells bilaterally.  Remainder the visualized paranasal sinuses and mastoid air cells are clear.  The skull is intact.  No evidence of scalp hematoma.  IMPRESSION:  1.  Extensive chronic white matter ischemic changes of prior lacunar infarctions. 2.  No acute intracranial abnormality is identified by CT. 3.  Chronic appearing ethmoid and inferior left frontal sinusitis.   Original Report Authenticated By: Britta Mccreedy, M.D.    Mr Brain Wo Contrast  12/24/2011  *RADIOLOGY REPORT*  Clinical Data: Code stroke  MRI HEAD WITHOUT CONTRAST  Technique:  Multiplanar, multiecho pulse sequences of the brain and  surrounding structures were obtained according to standard protocol without intravenous contrast.  Comparison: CT 12/24/2011  Findings: Diffusion weighted imaging only was obtained.  Small area of hyperintensity in the left posterior brainstem in the floor of the fourth ventricle.  This is seen on both 5 mm and 3 mm diffusion weighted imaging and is most likely a small area acute infarct.  Questionable area of acute infarct in the left middle cerebellar peduncle on the thin section diffusion.  No other definite acute infarct.  There is generalized atrophy.  IMPRESSION: Probable small area acute infarct in the floor of the fourth ventricle on the left.  Possible small acute infarct in the left middle cerebellar peduncle.  I discussed the findings by telephone with Dr. Amada Jupiter   Original Report Authenticated By: Janeece Riggers, M.D.     EKG: Independently reviewed.   Assessment/Plan Principal Problem:  *Arterial ischemic stroke, multifocal, posterior circulation, acute Active Problems:  CAD (coronary artery disease)  Hyperlipidemia  Type 2 diabetes mellitus with vascular disease  Hypertensive heart disease without CHF   Acute ischemic stroke -Multifocal involving the posterior circulation. -Patient seen initially as code stroke by neurology, no recommendation for tPA because of minimal symptoms. -Patient is on aspirin and Brilinta for his recent stents, continue dual antiplatelet therapy. -Stroke workup including full MRI/MRA, carotid duplex, 2-D echo, EKG, telemetry, PT/OT/SLP, FLP, A1c.  CAD -Patient had CABG x5 on 03/01/2011 by Dr. Laneta Simmers. -He had NSTEMI in August of 2013 treated by [facility] and started on aspirin and Brilinta. -Patient denies any chest pain, had transient nausea and lightheadedness, I will cycle cardiac enzymes. -Continue dual antiplatelet therapy and metoprolol.  Hypertension -Blood pressure was initially elevated upon arrival to the hospital likely secondary to the acute  stroke. -Patient also did not take his morning medications. -Restart home medicines, avoid hypotension in setting of acute stroke.  Diabetes mellitus type 2 -Check hemoglobin A1c, start insulin sliding scale. Hold metformin. -Swells screen to be done, if he passed start carbohydrate modified diet.   Code Status: Full code. Family Communication: Wife and daughter were at bedside Disposition Plan: Inpatient, neuro telemetry  Time spent: 70 minutes  [facility] Pager [phone]  If 7PM-7AM, please contact night-coverage [email or web address] Password [email or web address] 12/24/2011, 12:15 PM

## 2011-12-24 NOTE — Evaluation (Signed)
Occupational Therapy Evaluation Patient Details Name: Joel Santiago MRN: [id number] DOB: [birth year] Today's Date: 12/24/2011 Time: 4098-1191 OT Time Calculation (min): 19 min  OT Assessment / Plan / Recommendation Clinical Impression  Pt admitted with right side weakness. MRI found a probable small area acute infarct in the floor of the fourth ventricle on the left and a possible small acute infarct in the left middle cerebellar peduncle.  Eval limited by pt c/o dizziness and high BP upon sitting EOB.  Will determine d/c recommendation next session.    OT Assessment  Patient needs continued OT Services    Follow Up Recommendations   (TBD)    Barriers to Discharge      Equipment Recommendations  Other (comment) (TBD)    Recommendations for Other Services    Frequency  Min 3X/week    Precautions / Restrictions Precautions Precautions: Fall Restrictions Weight Bearing Restrictions: No   Pertinent Vitals/Pain BP 177/78 sitting EOB.  Pt returned to supine. RN made aware.    ADL  Eating/Feeding: Performed;Set up Where Assessed - Eating/Feeding: Bed level Upper Body Dressing: Performed;Minimal assistance Where Assessed - Upper Body Dressing: Supported sitting ADL Comments: ADL assessment limited due to pt dizziness.     OT Diagnosis: Generalized weakness;Paresis  OT Problem List: Decreased strength;Decreased range of motion;Decreased activity tolerance;Impaired balance (sitting and/or standing);Impaired vision/perception;Decreased coordination;Decreased knowledge of use of DME or AE;Impaired sensation;Impaired UE functional use OT Treatment Interventions: Self-care/ADL training;Therapeutic exercise;Neuromuscular education;DME and/or AE instruction;Therapeutic activities;Patient/family education;Visual/perceptual remediation/compensation;Balance training   OT Goals Acute Rehab OT Goals OT Goal Formulation: With patient Time For Goal Achievement: 01/07/12 Potential to Achieve  Goals: Good ADL Goals Pt Will Perform Grooming: with supervision;Sitting, chair;Sitting, edge of bed;Unsupported ADL Goal: Grooming - Progress: Goal set today Pt Will Perform Upper Body Bathing: with supervision;Sitting, chair;Sitting, edge of bed;Unsupported ADL Goal: Upper Body Bathing - Progress: Goal set today Pt Will Perform Lower Body Bathing: with supervision;Sit to stand from chair;Sit to stand from bed;Unsupported ADL Goal: Lower Body Bathing - Progress: Goal set today Pt Will Transfer to Toilet: with supervision;Ambulation;with DME;Comfort height toilet ADL Goal: Toilet Transfer - Progress: Goal set today Miscellaneous OT Goals Miscellaneous OT Goal #1: Pt will participate in vision testing to determine level of visual impairment (if present). OT Goal: Miscellaneous Goal #1 - Progress: Goal set today Miscellaneous OT Goal #2: Pt will perform static sitting balance task EOB >10 min with supervision. OT Goal: Miscellaneous Goal #2 - Progress: Goal set today Miscellaneous OT Goal #3: Pt will independently perform RUE AAROM/AROM 2-3x daily. OT Goal: Miscellaneous Goal #3 - Progress: Goal set today  Visit Information  Last OT Received On: 12/24/11 Assistance Needed: +2 PT/OT Co-Evaluation/Treatment: Yes    Subjective Data      Prior Functioning     Home Living Lives With: Spouse Available Help at Discharge: Family;Available 24 hours/day Type of Home: Apartment Home Access: Stairs to enter Entrance Stairs-Number of Steps: 1 Entrance Stairs-Rails: None Home Layout: One level Bathroom Shower/Tub: Forensic scientist: Standard Bathroom Accessibility: Yes How Accessible: Accessible via walker Home Adaptive Equipment: Straight cane Prior Function Level of Independence: Independent Able to Take Stairs?: Yes Driving: Yes Vocation: Retired Musician: No difficulties Dominant Hand: Right         Vision/Perception Vision -  Assessment Eye Alignment: Impaired (comment) Vision Assessment: Vision impaired - to be further tested in functional context Additional Comments: Redness noted in left eye.  Pt reports blurriness is gone when left eye covered but is  present with right eye covered. Will continue to assess. Pt unable to participate in vision testing due to dizziness.   Cognition  Overall Cognitive Status: Appears within functional limits for tasks assessed/performed Arousal/Alertness: Awake/alert Orientation Level: Appears intact for tasks assessed Behavior During Session: [facility] for tasks performed    Extremity/Trunk Assessment Right Upper Extremity Assessment RUE ROM/Strength/Tone: Deficits RUE ROM/Strength/Tone Deficits: 3-/5 throughout hand, elbow, and shoulder RUE Sensation: Deficits RUE Sensation Deficits: decreased sensation to light touch Right Lower Extremity Assessment RLE ROM/Strength/Tone: Deficits RLE ROM/Strength/Tone Deficits: grossly 4/5 RLE Sensation: Deficits RLE Sensation Deficits: decreased light touch throughout RLE Left Lower Extremity Assessment LLE ROM/Strength/Tone: Within functional levels LLE Sensation: WFL - Light Touch     Mobility Bed Mobility Bed Mobility: Supine to Sit;Sitting - Scoot to Edge of Bed;Sit to Supine Supine to Sit: 3: Mod assist;With rails;HOB elevated Sitting - Scoot to Edge of Bed: 4: Min assist Sit to Supine: 3: Mod assist;With rail;HOB elevated Details for Bed Mobility Assistance: VC for proper sequencing. Upon sitting, pt complained of dizziness. BP taken 178/77 and pt laid back down     Shoulder Instructions     Exercise     Balance Balance Balance Assessed: Yes Static Sitting Balance Static Sitting - Balance Support: Bilateral upper extremity supported;Feet supported Static Sitting - Level of Assistance: 1: +1 Total assist;4: Min assist Static Sitting - Comment/# of Minutes: Min assist for majority of sitting balance, although required total  assist at times secondary to pt with posterior left lateral lean.    End of Session OT - End of Session Equipment Utilized During Treatment:  (none) Activity Tolerance: Other (comment) (limited by dizziness, high BP) Patient left: in bed;with call bell/phone within reach;with family/visitor present Nurse Communication: Mobility status;Other (comment) (high BP)  GO   12/24/2011 Cipriano Mile OTR/L Pager [phone] Office [phone]   Cipriano Mile 12/24/2011, 4:09 PM

## 2011-12-24 NOTE — Consult Note (Signed)
Reason for Consult:Possible stroke Referring Physician: [name], D  CC: Nausea  History is obtained from:patient  HPI: Joel Santiago is a 71 y.o. male with a history of DM, HTN on asa+ticagrelor. He presented with blurred vision and unsteadiness that started at 8 am. It is associated with nausea and vomiting. He has not had anythign like this before.   LSN: 8am tpa given: no, mild deficit NIHSS: 3(only one of which is clearly new)  ROS: A 14 point ROS was performed and is negative except as noted in the HPI.  Past Medical History  Diagnosis Date  . Diabetes mellitus   . Hypertension   . Stroke   . Angina   . Coronary artery disease   . Hyperlipemia   . Cardiac angina     Family History: No history of strokes  Social History: Tob: denies  Exam: Current vital signs: BP 170/91  Pulse 81  Resp 22  Wt 88.451 kg (195 lb)  SpO2 100% Vital signs in last 24 hours: Pulse Rate:  [81] 81  (11/30 1044) Resp:  [20-22] 22  (11/30 1050) BP: (156-170)/(89-91) 170/91 mmHg (11/30 1050) SpO2:  [99 %-100 %] 100 % (11/30 1050) Weight:  [88.451 kg (195 lb)] 88.451 kg (195 lb) (11/30 0900)  General: In bed, has emesis bag CV: RRR Mental Status: Patient is awake, alert, oriented to person, place, month, year, and situation. Immediate and remote memory are intact. Patient is able to give a clear and coherent history. Cranial Nerves: II: Visual Fields are full. Pupils are equal, round, and reactive to light.  Discs are difficult to visualize. III,IV, VI: EOMI without ptosis or diploplia.  V: Facial sensation is symmetric to pin VII: Facial movement is symmetric.  VIII: hearing is intact to voice X: Uvula elevates symmetrically XI: Shoulder shrug is symmetric. XII: tongue may have very mild right deviation Motor: Tone is normal. Bulk is normal. 5/5 strength was present in all four extremities.  Sensory: Sensation is symmetric to light touch and pin in the arms and legs. Deep Tendon  Reflexes: 2+ and symmetric in the biceps and patellae. 0 at ankles Cerebellar: FNF with passpointing on right.  Gait: Did not assess secondary to patient safety concerns.   I have reviewed labs in epic and the results pertinent to this consultation are: CMP -elevated glucose CBC - wnl  I have reviewed the images obtained: MRI head - small brainstem infarct at the floor of the fourth ventricle.   Impression: 71 yo M with new onset unsteadiness, n/v, left eye paresis and new small brainstem infarct. I discussed with him the option of tPA and given the mild symptoms, it was decided to not proceed with it. His MRI was abbreviated, and will obtain full MRI when mra is obtained.  Recommendations: 1. HgbA1c, fasting lipid panel 2. MRI, MRA  of the brain without contrast 3. PT consult, OT consult, Speech consult 4. Echocardiogram 5. Carotid dopplers 6. Prophylactic therapy-asa+ticagrelor if passes bedside swallow 7. Risk factor modification 8. Telemetry monitoring 9. Frequent neuro checks 10. Permissive htn, would treat prn for SBP > 220 or DBP > 120.   Ritta Slot, MD [facility] [phone]  If 7pm- 7am, please page neurology on call at [phone].

## 2011-12-24 NOTE — Progress Notes (Signed)
  Echocardiogram 2D Echocardiogram has been performed.  Joel Santiago FRANCES 12/24/2011, 5:31 PM

## 2011-12-24 NOTE — ED Notes (Signed)
Patient denies pain and is resting comfortably.  

## 2011-12-25 ENCOUNTER — Inpatient Hospital Stay (HOSPITAL_COMMUNITY): Payer: Medicare Other

## 2011-12-25 DIAGNOSIS — E785 Hyperlipidemia, unspecified: Secondary | ICD-10-CM

## 2011-12-25 LAB — LIPID PANEL
HDL: 39 mg/dL — ABNORMAL LOW (ref >39)
LDL Cholesterol: 71 mg/dL (ref 0–99)
Total CHOL/HDL Ratio: 3.3 RATIO

## 2011-12-25 LAB — GLUCOSE, CAPILLARY
Glucose-Capillary: 137 mg/dL — ABNORMAL HIGH (ref 70–99)
Glucose-Capillary: 179 mg/dL — ABNORMAL HIGH (ref 70–99)

## 2011-12-25 LAB — HEMOGLOBIN A1C: Hgb A1c MFr Bld: 7.3 % — ABNORMAL HIGH (ref <5.7)

## 2011-12-25 NOTE — Progress Notes (Signed)
[facility] PROGRESS NOTE  Joel Santiago JWJ:[id number] DOB: 03-Mar-1940 DOA: 12/24/2011 PCP: No primary provider on file.  Assessment/Plan: Acute ischemic stroke  -Multifocal involving the posterior circulation.  -Patient seen initially as code stroke by neurology, no recommendation for tPA because of minimal symptoms.  -Continue aspirin and Brilinta for his recent stents -full MRI/MRA today still with Small area of acute infarct in the left ventral and posterior medulla, carotid duplex neg for ICA stenosis., 2-D echo -PT/OT recommending CIR, I have consulted, follow CAD  -Patient had CABG x5 on 03/01/2011 by Dr. Laneta Simmers.  -He had NSTEMI in August of 2013 treated by [facility] and started on aspirin and Brilinta.  -Patient denies any chest pain, had transient nausea and lightheadedness, I will cycle cardiac enzymes.  -Continue dual antiplatelet therapy and metoprolol.  Hypertension  -Blood pressure was initially elevated upon arrival to the hospital likely secondary to the acute stroke.  -Patient also did not take his  Medications on am of admit.  -continue home medicines, monitor and avoid hypotension in setting of acute stroke.  Diabetes mellitus type 2  -Check hemoglobin A1c 7.3, continue insulin sliding scale. Holding  metformin.       Code Status: full Family Communication: directly with pt at bedside  Disposition Plan: awaiting CIR eval   Consultants:  neuro  Procedures:  ECHO pending  Carotid duplex Preliminary report: Bilateral: No evidence of hemodynamically significant internal carotid artery stenosis. Vertebral artery flow is antegrade.   Antibiotics:  NONE  HPI/Subjective: Pt denies any new c/o, states feeling better today  Objective: Filed Vitals:   12/25/11 0001 12/25/11 0200 12/25/11 0600 12/25/11 0927  BP: 159/85 150/76 154/75 169/79  Pulse: 76 77 79 82  Temp: 98.2 F (36.8 C) 97.8 F (36.6 C) 98 F (36.7 C) 98.7 F (37.1 C)  TempSrc:    Oral   Resp: 18 18 18 18   Height:      Weight:      SpO2: 99% 97% 100% 97%    Intake/Output Summary (Last 24 hours) at 12/25/11 0933 Last data filed at 12/25/11 0731  Gross per 24 hour  Intake    600 ml  Output   1500 ml  Net   -900 ml   Filed Weights   12/24/11 0900 12/24/11 1330  Weight: 88.451 kg (195 lb) 88.451 kg (195 lb)    Exam:   General:  A&Ox3, in NAD  Cardiovascular:RRR  Respiratory: CTAB  Abdomen: soft +BS,NT/ND  Neuro:R.side strenght 5-/5, L5/5  Data Reviewed: Basic Metabolic Panel:  Lab 12/24/11 6213 12/24/11 0950  NA 141 137  K 3.6 3.6  CL 104 101  CO2 -- 25  GLUCOSE 267* 278*  BUN 7 8  CREATININE 0.80 0.65  CALCIUM -- 8.9  MG -- --  PHOS -- --   Liver Function Tests:  Lab 12/24/11 0950  AST 23  ALT 51  ALKPHOS 69  BILITOT 0.3  PROT 7.7  ALBUMIN 3.9   No results found for this basename: LIPASE:5,AMYLASE:5 in the last 168 hours No results found for this basename: AMMONIA:5 in the last 168 hours CBC:  Lab 12/24/11 1008 12/24/11 0950  WBC -- 5.6  NEUTROABS -- 3.0  HGB 14.3 13.2  HCT 42.0 41.4  MCV -- 88.1  PLT -- 186   Cardiac Enzymes:  Lab 12/24/11 1917 12/24/11 1333 12/24/11 0954  CKTOTAL -- -- --  CKMB -- -- --  CKMBINDEX -- -- --  TROPONINI <0.30 <0.30 <0.30   BNP (  last 3 results)  Basename 08/26/11 1430  PROBNP 1886.0*   CBG:  Lab 12/25/11 0644 12/24/11 2153 12/24/11 1603  GLUCAP 137* 183* 252*    No results found for this or any previous visit (from the past 240 hour(s)).   Studies: Dg Chest 2 View  12/24/2011  *RADIOLOGY REPORT*  Clinical Data: Stroke.  History of CABG.  CHEST - 2 VIEW  Comparison: Chest radiograph 08/26/2011  Findings: There are changes of median sternotomy for CABG. Mild cardiomegaly.  Cardiac leads project over the chest. Pulmonary vascularity appears mildly prominent.  The lungs are clear.  No airspace disease, effusion, or pneumothorax.  There is gaseous distention of the stomach, with an  air-fluid level seen in the stomach on the lateral view.  No acute bony abnormality.  IMPRESSION:  1.  Mild cardiomegaly and mild pulmonary vascular congestion. 2.  Visualized portion the stomach appears distended, and contains an air-fluid level.   Original Report Authenticated By: Britta Mccreedy, M.D.    Ct Head Wo Contrast  12/24/2011  *RADIOLOGY REPORT*  Clinical Data: Code stroke.  Patient with blurred vision, headache, and difficulty ambulating.  CT HEAD WITHOUT CONTRAST  Technique:  Contiguous axial images were obtained from the base of the skull through the vertex without contrast.  Comparison: No priors  Findings: The patient has fairly extensive bilateral chronic microvascular ischemic changes, with patchy areas of hypoattenuation in the periventricular white matter bilaterally, evidence of prior lacunar infarction in the right caudate head on image number 19, and hypoattenuation in the subinsular regions/basal ganglia bilaterally.  There is no evidence of acute cortically based infarction by CT.   Negative for hemorrhage, hydrocephalus, mass effect, or midline shift.  There is some mucosal thickening of the inferior left frontal sinus and anterior ethmoid air cells bilaterally.  Remainder the visualized paranasal sinuses and mastoid air cells are clear.  The skull is intact.  No evidence of scalp hematoma.  IMPRESSION:  1.  Extensive chronic white matter ischemic changes of prior lacunar infarctions. 2.  No acute intracranial abnormality is identified by CT. 3.  Chronic appearing ethmoid and inferior left frontal sinusitis.   Original Report Authenticated By: Britta Mccreedy, M.D.    Mr Brain Wo Contrast  12/24/2011  *RADIOLOGY REPORT*  Clinical Data: Code stroke  MRI HEAD WITHOUT CONTRAST  Technique:  Multiplanar, multiecho pulse sequences of the brain and surrounding structures were obtained according to standard protocol without intravenous contrast.  Comparison: CT 12/24/2011  Findings: Diffusion  weighted imaging only was obtained.  Small area of hyperintensity in the left posterior brainstem in the floor of the fourth ventricle.  This is seen on both 5 mm and 3 mm diffusion weighted imaging and is most likely a small area acute infarct.  Questionable area of acute infarct in the left middle cerebellar peduncle on the thin section diffusion.  No other definite acute infarct.  There is generalized atrophy.  IMPRESSION: Probable small area acute infarct in the floor of the fourth ventricle on the left.  Possible small acute infarct in the left middle cerebellar peduncle.  I discussed the findings by telephone with Dr. Amada Jupiter   Original Report Authenticated By: Janeece Riggers, M.D.     Scheduled Meds:   . aspirin EC  81 mg Oral Daily  . atorvastatin  80 mg Oral QHS  . heparin  5,000 Units Subcutaneous Q8H  . insulin aspart  0-9 Units Subcutaneous TID WC  . metoprolol  25 mg Oral BID  .  Ticagrelor  90 mg Oral BID  . [DISCONTINUED] alteplase  0.9 mg/kg Intravenous Once  . [DISCONTINUED] aspirin  81 mg Oral Daily  . [DISCONTINUED] fish oil-omega-3 fatty acids  1 capsule Oral Daily  . [DISCONTINUED] fish oil-omega-3 fatty acids  1 capsule Oral BID   Continuous Infusions:   . sodium chloride 100 mL/hr at 12/24/11 1358    Principal Problem:  *Arterial ischemic stroke, multifocal, posterior circulation, acute Active Problems:  CAD (coronary artery disease)  Hyperlipidemia  Type 2 diabetes mellitus with vascular disease  Hypertensive heart disease without CHF    Time spent:    Kela Millin  [facility] Pager [phone] If 8PM-8AM, please contact night-coverage at [email or web address], password [facility] 12/25/2011, 9:33 AM  LOS: 1 day

## 2011-12-25 NOTE — Progress Notes (Signed)
Joel Santiago is a 71 y.o. male with a history of DM, HTN on asa+ticagrelor. He presented with blurred vision and unsteadiness that started at 8 am 11/30. It is associated with nausea and vomiting.   MRI brain showed acute small stroke at left medulla, but extensive supratentorium ischemic changes. 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  Diabetes mellitus    .  Hypertension    .  Stroke    .  Angina    .  Coronary artery disease    .  Hyperlipemia    .  Cardiac angina     Family History:  No history of strokes  Social History:  Tob: denies  Exam:  Current vital signs:   97.4, 147/81, HR 39-85, R 16-22.   CV: RRR, Pulm: Clear to ausculation bilaterally.   Mental Status:  Patient is awake, alert, oriented to person, place, month, year, and situation.    Patient is able to give a clear and coherent history.  Cranial Nerves:  II: Visual Fields are full. Pupils are equal, round, and reactive to light.   III,IV, VI: EOMI without ptosis or diploplia. Left gaze induced horizontal left ward nystagmus. V: Facial sensation is symmetric to pin  VII: Facial movement is symmetric.  VIII: hearing is intact to voice  X: Uvula elevates symmetrically  XI: Shoulder shrug is symmetric.  XII: tongue may have very mild right deviation  Motor:  Slight right arm and leg weakness 5- Sensory:  Sensation is symmetric to light touch and pin in the arms and legs.  Deep Tendon Reflexes:  2+ and symmetric in the biceps and patellae. 0 at ankles  Cerebellar:  Mild right finger to nose, heel to shin dysmetria. Gait:  Did not assess secondary to patient safety concerns.     Impression: 71 yo M with new onset unsteadiness, n/v, right side dysmetria consistent with findings on MRI, small left medulla infarction.  Recommendations:    Complete evaluation with Echocardiogram, Carotid dopplers   Prophylactic therapy-asa+ticagrelor if passes  bedside swallow  PT/OT.

## 2011-12-25 NOTE — Progress Notes (Signed)
Physical Therapy Treatment Patient Details Name: Joel Santiago MRN: [id number] DOB: 22-Feb-1940 Today's Date: 12/25/2011 Time: 7829-5621 PT Time Calculation (min): 23 min  PT Assessment / Plan / Recommendation Comments on Treatment Session  Pt with no dizziness this session and was able to complete gait training. Pt still with right sided weakness, increased assistance needed for R knee control during stance phase of gait. Recommending CIR for increased strengthening and functional mobility prior to d/c home    Follow Up Recommendations  CIR     Does the patient have the potential to tolerate intense rehabilitation     Barriers to Discharge        Equipment Recommendations  Rolling walker with 5" wheels    Recommendations for Other Services Rehab consult  Frequency Min 4X/week   Plan Discharge plan needs to be updated;Frequency remains appropriate    Precautions / Restrictions Precautions Precautions: Fall Restrictions Weight Bearing Restrictions: No   Pertinent Vitals/Pain No complaints of pain    Mobility  Bed Mobility Bed Mobility: Supine to Sit;Sitting - Scoot to [place] of Bed;Sit to Supine Supine to Sit: 4: Min assist Sitting - Scoot to [place] of Bed: 4: Min guard Details for Bed Mobility Assistance: Min assist for trunk stability, majority of bed mobility required no physiacl assist cueing only for proper sequencing Transfers Transfers: Sit to Stand;Stand to Sit Sit to Stand: With upper extremity assist;From bed;4: Min assist;3: Mod assist;From chair/3-in-1 Stand to Sit: 4: Min assist;With upper extremity assist;To chair/3-in-1 Stand Pivot Transfers: 3: Mod assist Details for Transfer Assistance: Assist for stability and controlled descent. Cues for safe hand placement and safety standing to/from RW. Mod assist transferring from bed to chair, manual facilitation at R knee to prevent buckling Ambulation/Gait Ambulation/Gait Assistance: 3: Mod assist Ambulation Distance  (Feet): 40 Feet Assistive device: Rolling walker Ambulation/Gait Assistance Details: Mod assist for support and stability as well as manual facilitation at R quads to prevent buckling and hyperextension during stance phase. Cueing for proper gait pattern. Gait Pattern: Right flexed knee in stance;Decreased stride length Gait velocity: slow gait speed Stairs: No Modified Rankin (Stroke Patients Only) Modified Rankin: Moderately severe disability    Exercises     PT Diagnosis:    PT Problem List:   PT Treatment Interventions:     PT Goals Acute Rehab PT Goals PT Goal: Supine/Side to Sit - Progress: Progressing toward goal PT Goal: Sit to Supine/Side - Progress: Progressing toward goal PT Goal: Sit to Stand - Progress: Progressing toward goal PT Goal: Stand to Sit - Progress: Progressing toward goal PT Transfer Goal: Bed to Chair/Chair to Bed - Progress: Progressing toward goal Pt will Ambulate: with supervision;with least restrictive assistive device;>150 feet PT Goal: Ambulate - Progress: Updated due to goal met  Visit Information  Last PT Received On: 12/25/11 Assistance Needed: +2 PT/OT Co-Evaluation/Treatment: Yes    Subjective Data      Cognition  Overall Cognitive Status: Appears within functional limits for tasks assessed/performed Arousal/Alertness: Awake/alert Orientation Level: Appears intact for tasks assessed Behavior During Session: [facility] for tasks performed    Balance     End of Session PT - End of Session Equipment Utilized During Treatment: Gait belt Activity Tolerance: Patient tolerated treatment well Patient left: in chair;with call bell/phone within reach;with family/visitor present Nurse Communication: Mobility status   GP     Milana Kidney 12/25/2011, 1:41 PM

## 2011-12-25 NOTE — Progress Notes (Signed)
Occupational Therapy Treatment Patient Details Name: Taiga Lupinacci MRN: [id number] DOB: 06-10-1940 Today's Date: 12/25/2011 Time: 4098-1191 OT Time Calculation (min): 23 min  OT Assessment / Plan / Recommendation Comments on Treatment Session Pt feeling better today and reports he is experiencing less dizziness.  Continues to c/o of blurring in left eye, and OT will further assess next session.    Follow Up Recommendations  CIR    Barriers to Discharge       Equipment Recommendations   (tbd)    Recommendations for Other Services Rehab consult  Frequency Min 3X/week   Plan Discharge plan remains appropriate    Precautions / Restrictions Precautions Precautions: Fall Restrictions Weight Bearing Restrictions: No   Pertinent Vitals/Pain See vitals    ADL  Lower Body Dressing: Performed;Min guard (donned socks) Where Assessed - Lower Body Dressing: Unsupported sitting Toilet Transfer: Simulated;Moderate assistance Toilet Transfer Method: Sit to stand Toilet Transfer Equipment: Other (comment) (bed to chair) Equipment Used: Gait belt;Rolling walker Transfers/Ambulation Related to ADLs: mod assist with RW and manual faciliation to right knee to prevent buckling ADL Comments: 3+/5 throughout R UE today (3-/5 on 11/30). Pt reports he is still experiencing blurring in left eye.  Noted that his eye is less red than on 11/30 during eval.  Tracking in bil eyes [facility].      OT Diagnosis:    OT Problem List:   OT Treatment Interventions:     OT Goals ADL Goals Pt Will Perform Upper Body Dressing: with supervision;Sitting, chair;Sitting, bed;Unsupported ADL Goal: Upper Body Dressing - Progress: Goal set today Pt Will Perform Lower Body Dressing: with supervision;Sit to stand from chair;Sit to stand from bed ADL Goal: Lower Body Dressing - Progress: Goal set today Pt Will Transfer to Toilet: with supervision;Ambulation;with DME;Comfort height toilet ADL Goal: Toilet Transfer - Progress:  Progressing toward goals Miscellaneous OT Goals Miscellaneous OT Goal #1: Pt will participate in vision testing to determine level of visual impairment (if present). OT Goal: Miscellaneous Goal #1 - Progress: Progressing toward goals Miscellaneous OT Goal #3: Pt will independently perform RUE AAROM/AROM 2-3x daily. OT Goal: Miscellaneous Goal #3 - Progress: Progressing toward goals  Visit Information  Last OT Received On: 12/25/11 Assistance Needed: +2 (if ambulating, for safety)    Subjective Data      Prior Functioning       Cognition  Overall Cognitive Status: Appears within functional limits for tasks assessed/performed Arousal/Alertness: Awake/alert Orientation Level: Appears intact for tasks assessed Behavior During Session: [facility] for tasks performed    Mobility  Shoulder Instructions Bed Mobility Bed Mobility: Supine to Sit;Sitting - Scoot to Edge of Bed;Sit to Supine Supine to Sit: 4: Min assist Sitting - Scoot to [place] of Bed: 4: Min guard Details for Bed Mobility Assistance: min assist to support trunk OOB Transfers Transfers: Sit to Stand;Stand to Sit Sit to Stand: 3: Mod assist;From bed;From chair/3-in-1;With armrests;With upper extremity assist Stand to Sit: 4: Min assist;To bed;To chair/3-in-1;With armrests;With upper extremity assist Details for Transfer Assistance: Assist for stability and to control descent.  physical assist to prevent right knee from buckling.       Exercises      Balance     End of Session OT - End of Session Equipment Utilized During Treatment: Gait belt Activity Tolerance: Patient tolerated treatment well Patient left: in chair;with call bell/phone within reach;with family/visitor present Nurse Communication: Mobility status  GO    12/25/2011 Cipriano Mile OTR/L Pager [phone] Office [phone]  Smitty Pluck  Elizabeth 12/25/2011, 4:18 PM

## 2011-12-26 DIAGNOSIS — I6330 Cerebral infarction due to thrombosis of unspecified cerebral artery: Secondary | ICD-10-CM

## 2011-12-26 LAB — GLUCOSE, CAPILLARY: Glucose-Capillary: 166 mg/dL — ABNORMAL HIGH (ref 70–99)

## 2011-12-26 MED ORDER — METOPROLOL TARTRATE 50 MG PO TABS: 25.0000 mg | ORAL_TABLET | Freq: Two times a day (BID) | ORAL | Status: AC

## 2011-12-26 NOTE — Progress Notes (Signed)
Stroke Team Progress Note  HISTORY Joel Santiago is a 71 y.o. male with a history of DM, HTN on asa+ticagrelor. He presented with blurred vision and unsteadiness that started at 8 am 12/24/2011. It is associated with nausea and vomiting. He has not had anythign like this before. Patient was not a TPA candidate secondary to mild deficit. He was admitted for further evaluation and treatment.  SUBJECTIVE  he up in the chair at the bedside.  Overall he feels his condition is gradually improving. He does not feel he is back to normal, still with double vision.  OBJECTIVE Most recent Vital Signs: Filed Vitals:   12/25/11 2119 12/26/11 0300 12/26/11 0640 12/26/11 0928  BP: 144/67 159/82 156/75 140/73  Pulse: 77 66 71 77  Temp: 97.4 F (36.3 C) 97.7 F (36.5 C) 97.6 F (36.4 C) 97.6 F (36.4 C)  TempSrc: Oral Oral Oral Oral  Resp: 16 16 18 18   Height:      Weight:      SpO2: 98% 98% 97% 96%   CBG (last 3)   Basename 12/26/11 0636 12/25/11 2208 12/25/11 1601  GLUCAP 166* 241* 179*    IV Fluid Intake:     . sodium chloride Stopped (12/25/11 1000)    MEDICATIONS    . aspirin EC  81 mg Oral Daily  . atorvastatin  80 mg Oral QHS  . heparin  5,000 Units Subcutaneous Q8H  . insulin aspart  0-9 Units Subcutaneous TID WC  . metoprolol  25 mg Oral BID  . Ticagrelor  90 mg Oral BID   PRN:  acetaminophen, nitroGLYCERIN, ondansetron (ZOFRAN) IV  Diet:  Carb Control thin liquids Activity:  Up as tolerated DVT Prophylaxis:  Heparin 5000 units sq tid  CLINICALLY SIGNIFICANT STUDIES Basic Metabolic Panel:  Lab 12/24/11 1610 12/24/11 0950  NA 141 137  K 3.6 3.6  CL 104 101  CO2 -- 25  GLUCOSE 267* 278*  BUN 7 8  CREATININE 0.80 0.65  CALCIUM -- 8.9  MG -- --  PHOS -- --   Liver Function Tests:  Lab 12/24/11 0950  AST 23  ALT 51  ALKPHOS 69  BILITOT 0.3  PROT 7.7  ALBUMIN 3.9   CBC:  Lab 12/24/11 1008 12/24/11 0950  WBC -- 5.6  NEUTROABS -- 3.0  HGB 14.3 13.2  HCT  42.0 41.4  MCV -- 88.1  PLT -- 186   Coagulation:  Lab 12/24/11 0950  LABPROT 13.4  INR 1.03   Cardiac Enzymes:  Lab 12/24/11 1917 12/24/11 1333 12/24/11 0954  CKTOTAL -- -- --  CKMB -- -- --  CKMBINDEX -- -- --  TROPONINI <0.30 <0.30 <0.30   Urinalysis: No results found for this basename: COLORURINE:2,APPERANCEUR:2,LABSPEC:2,PHURINE:2,GLUCOSEU:2,HGBUR:2,BILIRUBINUR:2,KETONESUR:2,PROTEINUR:2,UROBILINOGEN:2,NITRITE:2,LEUKOCYTESUR:2 in the last 168 hours Lipid Panel    Component Value Date/Time   CHOL 128 12/25/2011 0545   TRIG 89 12/25/2011 0545   HDL 39* 12/25/2011 0545   CHOLHDL 3.3 12/25/2011 0545   VLDL 18 12/25/2011 0545   LDLCALC 71 12/25/2011 0545   HgbA1C  Lab Results  Component Value Date   HGBA1C 7.3* 12/25/2011    Urine Drug Screen:   No results found for this basename: labopia, cocainscrnur, labbenz, amphetmu, thcu, labbarb    Alcohol Level: No results found for this basename: ETH:2 in the last 168 hours  CT of the brain  12/24/2011    1.  Extensive chronic white matter ischemic changes of prior lacunar infarctions. 2.  No acute intracranial abnormality is identified by  CT. 3.  Chronic appearing ethmoid and inferior left frontal sinusitis.    MRI of the brain   12/25/2011  Small area of acute infarct in the left ventral and posterior medulla, slightly better seen on today's study.  No other areas of acute infarct.  Atrophy and moderately severe chronic ischemic change.  12/24/2011   Probable small area acute infarct in the floor of the fourth ventricle on the left.  Possible small acute infarct in the left middle cerebellar peduncle.    MRA of the brain  12/25/2011 Occluded distal left vertebral artery.  Diffuse intracranial atherosclerotic disease.     2D Echocardiogram  EF 45-50% in the setting of ventricular ectopy. There is hypokinesis of the basalinferoseptal myocardium. There is hypokinesis of the distalinferior myocardium. no source of embolus.   Carotid  Doppler  No evidence of hemodynamically significant internal carotid artery stenosis. Vertebral artery flow is antegrade.   CXR  12/24/2011   1.  Mild cardiomegaly and mild pulmonary vascular congestion. 2.  Visualized portion the stomach appears distended, and contains an air-fluid level.   EKG  normal EKG, normal sinus rhythm.   Therapy Recommendations PT - CIR; OT - CIR  Physical Exam CV: RRR,  Pulm: Clear to ausculation bilaterally.  Mental Status:  Patient is awake, alert, oriented to person, place, month, year, and situation.  Patient is able to give a clear and coherent history.  Cranial Nerves:  II: Visual Fields are full. Pupils are equal, round, and reactive to light.  III,IV, VI: EOMI without ptosis or diploplia. Left gaze induced horizontal left ward nystagmus.  V: Facial sensation is symmetric to pin  VII: Facial movement is symmetric.  VIII: hearing is intact to voice  X: Uvula elevates symmetrically  XI: Shoulder shrug is symmetric.  XII: tongue may have very mild right deviation  Motor: Slight right arm and leg weakness 5-  Sensory: Sensation is symmetric to light touch and pin in the arms and legs.  Deep Tendon Reflexes: 2+ and symmetric in the biceps and patellae. 0 at ankles  Cerebellar: Mild right finger to nose, heel to shin dysmetria.  Gait: Did not assess secondary to patient safety concerns.   ASSESSMENT Mr. Joel Santiago is a 71 y.o. male presenting with unsteady gait, nausea, vomiting, right dysmetria.  Imaging confirms a small left medullary infarct. Infarct felt to be thrombotic secondary to small vessel disease.  Work up completed; await 2D results. On aspirin 81 mg orally every day and Brillinta prior to admission. Now on aspirin 81 mg orally every day and Brilinta for secondary stroke prevention. Patient with resultant ataxia and dizziness.  Hyperlipidemia, LDL 71, on statin PTA, at goal LDL < 100 Diabetes, HgbA1c 7.3 Hypertension CAD with angina,  recent stent placement on Brilinta, CABG x5 03/01/2011, MI 08/2011 Hx stroke  Hospital day # 2  TREATMENT/PLAN  Continue aspirin 81 mg orally every day and Brilinta for secondary stroke prevention.  F/u 2D results  Agree with plans for inpatient rehab  Stroke Service will sign off. Follow up with Dr. Pearlean Brownie, Stroke Clinic, in 2 months.  Annie Main, MSN, RN, ANVP-BC, ANP-BC, [facility] Pager: [phone] 12/26/2011 10:02 AM   I have personally examined this patient,reviewed pertinent data and developed plan of care. I agree with above  Delia Heady, MD Medical Director [facility] Pager: [phone] 12/26/2011 7:53 PM

## 2011-12-26 NOTE — Progress Notes (Signed)
Utilization review complete 

## 2011-12-26 NOTE — Consult Note (Signed)
Physical Medicine and Rehabilitation Consult Reason for Consult: CVA Referring Physician: [facility]   HPI: Joel Santiago is a 71 y.o. 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 Admitted 12/24/2011 with right-sided weakness as well as nausea with some dizziness. MRI of the brain showed small area of acute infarct in the left ventral and posterior medulla. 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 Echocardiogram with ejection fraction of 50% and grade 1 diastolic dysfunction. Carotid Dopplers with no ICA stenosis. Patient did not receive TPA. Neurology services consulted placed on aspirin therapy as well as subcutaneous heparin for DVT prophylaxis. Patient is tolerating a regular diet. Hemoglobin A1c is 7.3 with sliding scale insulin as directed. Physical and occupational therapy evaluations completed an ongoing with recommendations for physical medicine rehabilitation consult to consider inpatient rehabilitation services   Review of Systems  Gastrointestinal: Positive for nausea.  Musculoskeletal: Positive for myalgias.  Neurological: Positive for dizziness and weakness.  All other systems reviewed and are negative.   Past Medical History  Diagnosis Date  . Diabetes mellitus   . Hypertension   . Stroke   . Angina   . Coronary artery disease   . Hyperlipemia   . Cardiac angina    Past Surgical History  Procedure Date  . Coronary artery bypass graft 03/01/2011    Procedure: CORONARY ARTERY BYPASS GRAFTING (CABG);  Surgeon: Alleen Borne, MD;  Location: [facility] OR;  Service: Open Heart Surgery;  Laterality: N/A;  Coronary artery bypass graft times five on pump using left internal mammary artery and right greater saphenous vein via endovein harvest.   Family History  Problem Relation Age of Onset  . CAD Father   . CAD Mother    Social History:  reports that  he has been smoking Cigarettes.  He has been smoking about .5 packs per day. He has never used smokeless tobacco. He reports that he does not drink alcohol or use illicit drugs. Allergies: No Known Allergies Medications Prior to Admission  Medication Sig Dispense Refill  . acetaminophen (TYLENOL) 500 MG tablet Take 1,000 mg by mouth every 6 (six) hours as needed. For pain      . aspirin 81 MG tablet Take 81 mg by mouth daily.      Marland Kitchen atorvastatin (LIPITOR) 80 MG tablet Take 80 mg by mouth at bedtime.      . fish oil-omega-3 fatty acids 1000 MG capsule Take 1 capsule by mouth daily.       . metFORMIN (GLUCOPHAGE) 1000 MG tablet Take 1,000 mg by mouth 2 (two) times daily with a meal.       . metoprolol (LOPRESSOR) 50 MG tablet Take 25 mg by mouth 2 (two) times daily.      Marland Kitchen PRESCRIPTION MEDICATION Take 1 tablet by mouth daily. Evacetrapib or placebo. Pt is on drug study.      . Ticagrelor (BRILINTA) 90 MG TABS tablet Take 1 tablet (90 mg total) by mouth 2 (two) times daily.  60 tablet  12  . nitroGLYCERIN (NITROSTAT) 0.4 MG SL tablet Place 1 tablet (0.4 mg total) under the tongue every 5 (five) minutes x 3 doses as needed for chest pain.  25 tablet  12    Home: Home Living Lives With: Spouse Available Help at Discharge: Family;Available 24 hours/day Type of Home: Apartment Home Access: Stairs to enter Entrance Stairs-Number of Steps: 1 Entrance Stairs-Rails: None  Home Layout: One level Bathroom Shower/Tub: Counselling psychologist: Yes How Accessible: Accessible via walker Home Adaptive Equipment: Straight cane  Functional History: Prior Function Able to Take Stairs?: Yes Driving: Yes Vocation: Retired Functional Status:  Mobility: Bed Mobility Bed Mobility: Supine to Sit;Sitting - Scoot to [place] of Bed;Sit to Supine Supine to Sit: 4: Min assist Sitting - Scoot to Edge of Bed: 4: Min guard Sit to Supine: 3: Mod assist;With rail;HOB  elevated Transfers Transfers: Sit to Stand;Stand to Sit Sit to Stand: 3: Mod assist;From bed;From chair/3-in-1;With armrests;With upper extremity assist Stand to Sit: 4: Min assist;To bed;To chair/3-in-1;With armrests;With upper extremity assist Stand Pivot Transfers: 3: Mod assist Ambulation/Gait Ambulation/Gait Assistance: 3: Mod assist Ambulation Distance (Feet): 40 Feet Assistive device: Rolling walker Ambulation/Gait Assistance Details: Mod assist for support and stability as well as manual facilitation at R quads to prevent buckling and hyperextension during stance phase. Cueing for proper gait pattern. Gait Pattern: Right flexed knee in stance;Decreased stride length Gait velocity: slow gait speed Stairs: No    ADL: ADL Eating/Feeding: Performed;Set up Where Assessed - Eating/Feeding: Bed level Upper Body Dressing: Performed;Minimal assistance Where Assessed - Upper Body Dressing: Supported sitting Lower Body Dressing: Performed;Min guard (donned socks) Where Assessed - Lower Body Dressing: Unsupported sitting Toilet Transfer: Simulated;Moderate assistance Toilet Transfer Method: Sit to stand Toilet Transfer Equipment: Other (comment) (bed to chair) Equipment Used: Gait belt;Rolling walker Transfers/Ambulation Related to ADLs: mod assist with RW and manual faciliation to right knee to prevent buckling ADL Comments: 3+/5 throughout R UE today (3-/5 on 11/30). Pt reports he is still experiencing blurring in left eye.  Noted that his eye is less red than on 11/30 during eval.  Tracking in bil eyes [facility].    Cognition: Cognition Arousal/Alertness: Awake/alert Orientation Level: Oriented X4 Cognition Overall Cognitive Status: Appears within functional limits for tasks assessed/performed Arousal/Alertness: Awake/alert Orientation Level: Appears intact for tasks assessed Behavior During Session: [facility] for tasks performed  Blood pressure 159/82, pulse 66, temperature 97.7 F (36.5  C), temperature source Oral, resp. rate 16, height 6' (1.829 m), weight 88.451 kg (195 lb), SpO2 98.00%. Physical Exam  Vitals reviewed. Constitutional: He is oriented to person, place, and time. He appears well-developed.  HENT:  Head: Normocephalic.  Eyes:       Pupils round and reactive to light  Neck: Neck supple. No thyromegaly present.  Cardiovascular: Normal rate and regular rhythm.   Pulmonary/Chest: Effort normal and breath sounds normal. No respiratory distress.  Abdominal: Soft. Bowel sounds are normal. He exhibits no distension.  Musculoskeletal: He exhibits no edema.  Neurological: He is alert and oriented to person, place, and time.       Follows full commands. Cognition appropriate. Mild dysconjugate gaze with gaze to the right. Decreased FMC and mild ataxia RUE with FTN, isolated finger movements. Sensation slightly diminished on right arm and hand. RLE with decreased FMC but lesser so. Strength grossly is 4+ on the right side. Good sitting balance.  Skin: Skin is warm and dry.  Psychiatric: He has a normal mood and affect.    Results for orders placed during the hospital encounter of 12/24/11 (from the past 24 hour(s))  GLUCOSE, CAPILLARY     Status: Abnormal   Collection Time   12/25/11  6:44 AM      Component Value Range   Glucose-Capillary 137 (*) 70 - 99 mg/dL  GLUCOSE, CAPILLARY     Status: Abnormal   Collection Time  12/25/11 11:28 AM      Component Value Range   Glucose-Capillary 187 (*) 70 - 99 mg/dL  GLUCOSE, CAPILLARY     Status: Abnormal   Collection Time   12/25/11  4:01 PM      Component Value Range   Glucose-Capillary 179 (*) 70 - 99 mg/dL  GLUCOSE, CAPILLARY     Status: Abnormal   Collection Time   12/25/11 10:08 PM      Component Value Range   Glucose-Capillary 241 (*) 70 - 99 mg/dL   Comment 1 Notify RN     Dg Chest 2 View  12/24/2011  *RADIOLOGY REPORT*  Clinical Data: Stroke.  History of CABG.  CHEST - 2 VIEW  Comparison: Chest radiograph  08/26/2011  Findings: There are changes of median sternotomy for CABG. Mild cardiomegaly.  Cardiac leads project over the chest. Pulmonary vascularity appears mildly prominent.  The lungs are clear.  No airspace disease, effusion, or pneumothorax.  There is gaseous distention of the stomach, with an air-fluid level seen in the stomach on the lateral view.  No acute bony abnormality.  IMPRESSION:  1.  Mild cardiomegaly and mild pulmonary vascular congestion. 2.  Visualized portion the stomach appears distended, and contains an air-fluid level.   Original Report Authenticated By: Britta Mccreedy, M.D.    Ct Head Wo Contrast  12/24/2011  *RADIOLOGY REPORT*  Clinical Data: Code stroke.  Patient with blurred vision, headache, and difficulty ambulating.  CT HEAD WITHOUT CONTRAST  Technique:  Contiguous axial images were obtained from the base of the skull through the vertex without contrast.  Comparison: No priors  Findings: The patient has fairly extensive bilateral chronic microvascular ischemic changes, with patchy areas of hypoattenuation in the periventricular white matter bilaterally, evidence of prior lacunar infarction in the right caudate head on image number 19, and hypoattenuation in the subinsular regions/basal ganglia bilaterally.  There is no evidence of acute cortically based infarction by CT.   Negative for hemorrhage, hydrocephalus, mass effect, or midline shift.  There is some mucosal thickening of the inferior left frontal sinus and anterior ethmoid air cells bilaterally.  Remainder the visualized paranasal sinuses and mastoid air cells are clear.  The skull is intact.  No evidence of scalp hematoma.  IMPRESSION:  1.  Extensive chronic white matter ischemic changes of prior lacunar infarctions. 2.  No acute intracranial abnormality is identified by CT. 3.  Chronic appearing ethmoid and inferior left frontal sinusitis.   Original Report Authenticated By: Britta Mccreedy, M.D.    Mr Brain Wo  Contrast  12/25/2011  *RADIOLOGY REPORT*  Clinical Data:  Stroke  MRI HEAD WITHOUT CONTRAST MRA HEAD WITHOUT CONTRAST  Technique:  Multiplanar, multiecho pulse sequences of the brain and surrounding structures were obtained without intravenous contrast. Angiographic images of the head were obtained using MRA technique without contrast.  Comparison:  Limited MR head 12/24/2011  MRI HEAD  Findings:  Diffusion weighted imaging is positive for a small area of acute infarct in the left ventral medulla.  This measures approximately 2 mm in size and is better seen on today's diffusion imaging.  There is also a small area of restricted diffusion in the left posterior medulla in the floor the ventricle, as noted on the study yesterday.  No other areas of acute infarct are present.  Moderate atrophy.  Moderate chronic microvascular ischemic change throughout the cerebral white matter bilaterally.  Chronic ischemic change in the pons.  Small chronic infarct right inferior cerebellum.  Negative  for intracranial hemorrhage or mass.  Abnormal signal distal left vertebral artery which may indicate severe stenosis or occlusion.  Based on the MRA this appears occluded.  IMPRESSION: Small area of acute infarct in the left ventral and posterior medulla, slightly better seen on today's study.  No other areas of acute infarct.  Atrophy and moderately severe chronic ischemic change.  MRA HEAD  Findings: Left vertebral artery ends in pica.  The post PICA segment is occluded.  Right vertebral artery is patent with mild stenosis distally.  There is atherosclerotic disease in the basilar with a mild to moderate stenosis in the mid basilar.  The superior cerebellar and posterior cerebral arteries are patent bilaterally with atherosclerotic irregularity.  There is a patent posterior communicating artery on the left.  Internal carotid artery is patent bilaterally with atherosclerotic irregularity but no significant stenosis.  Anterior and  middle cerebral arteries are patent bilaterally with diffuse atherosclerotic irregularity.  Negative for aneurysm.  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   Original Report Authenticated By: Janeece Riggers, M.D.    Mr Brain Wo Contrast  12/24/2011  *RADIOLOGY REPORT*  Clinical Data: Code stroke  MRI HEAD WITHOUT CONTRAST  Technique:  Multiplanar, multiecho pulse sequences of the brain and surrounding structures were obtained according to standard protocol without intravenous contrast.  Comparison: CT 12/24/2011  Findings: Diffusion weighted imaging only was obtained.  Small area of hyperintensity in the left posterior brainstem in the floor of the fourth ventricle.  This is seen on both 5 mm and 3 mm diffusion weighted imaging and is most likely a small area acute infarct.  Questionable area of acute infarct in the left middle cerebellar peduncle on the thin section diffusion.  No other definite acute infarct.  There is generalized atrophy.  IMPRESSION: Probable small area acute infarct in the floor of the fourth ventricle on the left.  Possible small acute infarct in the left middle cerebellar peduncle.  I discussed the findings by telephone with Dr. Amada Jupiter   Original Report Authenticated By: Janeece Riggers, M.D.    Mr Mra Head/brain Wo Cm  12/25/2011  *RADIOLOGY REPORT*  Clinical Data:  Stroke  MRI HEAD WITHOUT CONTRAST MRA HEAD WITHOUT CONTRAST  Technique:  Multiplanar, multiecho pulse sequences of the brain and surrounding structures were obtained without intravenous contrast. Angiographic images of the head were obtained using MRA technique without contrast.  Comparison:  Limited MR head 12/24/2011  MRI HEAD  Findings:  Diffusion weighted imaging is positive for a small area of acute infarct in the left ventral medulla.  This measures approximately 2 mm in size and is better seen on today's diffusion imaging.  There is also a small area of  restricted diffusion in the left posterior medulla in the floor the ventricle, as noted on the study yesterday.  No other areas of acute infarct are present.  Moderate atrophy.  Moderate chronic microvascular ischemic change throughout the cerebral white matter bilaterally.  Chronic ischemic change in the pons.  Small chronic infarct right inferior cerebellum.  Negative for intracranial hemorrhage or mass.  Abnormal signal distal left vertebral artery which may indicate severe stenosis or occlusion.  Based on the MRA this appears occluded.  IMPRESSION: Small area of acute infarct in the left ventral and posterior medulla, slightly better seen on today's study.  No other areas of acute infarct.  Atrophy and moderately severe chronic ischemic change.  MRA HEAD  Findings: Left vertebral artery ends in pica.  The post PICA segment is occluded.  Right vertebral artery is patent with mild stenosis distally.  There is atherosclerotic disease in the basilar with a mild to moderate stenosis in the mid basilar.  The superior cerebellar and posterior cerebral arteries are patent bilaterally with atherosclerotic irregularity.  There is a patent posterior communicating artery on the left.  Internal carotid artery is patent bilaterally with atherosclerotic irregularity but no significant stenosis.  Anterior and middle cerebral arteries are patent bilaterally with diffuse atherosclerotic irregularity.  Negative for aneurysm.  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   Original Report Authenticated By: Janeece Riggers, M.D.     Assessment/Plan: Diagnosis: left medullary stroke 1. Does the need for close, 24 hr/day medical supervision in concert with the patient's rehab needs make it unreasonable for this patient to be served in a less intensive setting? Potentially 2. Co-Morbidities requiring supervision/potential complications: dm, cad, chf 3. Due to bladder management,  bowel management, safety, skin/wound care, disease management, medication administration, pain management and patient education, does the patient require 24 hr/day rehab nursing? Potentially 4. Does the patient require coordinated care of a physician, rehab nurse, PT (1-2 hrs/day, 5 days/week) and OT (1-2 hrs/day, 5 days/week) to address physical and functional deficits in the context of the above medical diagnosis(es)? Potentially Addressing deficits in the following areas: balance, endurance, locomotion, strength, transferring, bowel/bladder control, bathing, dressing, feeding, grooming, toileting and psychosocial support 5. Can the patient actively participate in an intensive therapy program of at least 3 hrs of therapy per day at least 5 days per week? Yes 6. The potential for patient to make measurable gains while on inpatient rehab is good and fair 7. Anticipated functional outcomes upon discharge from inpatient rehab are supervision with PT, supervision with OT, n/a with SLP. 8. Estimated rehab length of stay to reach the above functional goals is: TBD 9. Does the patient have adequate social supports to accommodate these discharge functional goals? Yes 10. Anticipated D/C setting: Home 11. Anticipated post D/C treatments: HH therapy 12. Overall Rehab/Functional Prognosis: excellent  RECOMMENDATIONS: This patient's condition is appropriate for continued rehabilitative care in the following setting: HH/ outpt therapies. Patient has agreed to participate in recommended program. Yes Note that insurance prior authorization may be required for reimbursement for recommended care.  Comment: Pt appears to have made neurological progress over the weekend. May have improved enough where he could return home to his apartment with his wife. Therapy seeing this am and will assess balance, safety, etc.  Ivory Broad, MD   12/26/2011

## 2011-12-26 NOTE — Progress Notes (Signed)
Occupational Therapy Treatment Patient Details Name: Joel Santiago MRN: [id number] DOB: 19-Jun-1940 Today's Date: 12/26/2011 Time: 4098-1191 OT Time Calculation (min): 50 min  OT Assessment / Plan / Recommendation Comments on Treatment Session No c/o of dizziness.  Improvement in R UE strength and functional use for ADL.  Pt very motivated to return home.  Wants to use cane, but is aware he is safer with the RW at this point.  Slight blurriness continues in L eye, but does not limit him in reading the newspaper or perform functional activities during session.    Follow Up Recommendations  CIR    Barriers to Discharge       Equipment Recommendations  Rolling walker with 5" wheels    Recommendations for Other Services Rehab consult  Frequency Min 3X/week   Plan Discharge plan remains appropriate    Precautions / Restrictions Precautions Precautions: Fall Restrictions Weight Bearing Restrictions: No   Pertinent Vitals/Pain No pain    ADL  Eating/Feeding: Independent Where Assessed - Eating/Feeding: Chair Grooming: Wash/dry face;Brushing hair;Supervision/safety Where Assessed - Grooming: Supported standing Upper Body Dressing: Set up Where Assessed - Upper Body Dressing: Unsupported sitting Lower Body Dressing: Set up;Other (comment) (socks) Where Assessed - Lower Body Dressing: Unsupported sitting Toilet Transfer: Min guard Toilet Transfer Method: Sit to Barista: Comfort height toilet Equipment Used: Gait belt;Rolling walker Transfers/Ambulation Related to ADLs: min guard assist for sit<>stand and ambulation with RW. ADL Comments: Pt now able to lead with R UE for ADL including eating, opening containers, use of washcloth. Pt legibly able to write, but reports it is not at baseline in quality.  Demonstrated 4/5 strength R shoulder, elbow flex, forearm, wrist and gross grasp,  R elbow extension 3+/5.  Worked on PNF patterns with resistance.  Issued tputty  and instructed in use.  Left pen and line paper for practice.      OT Diagnosis:    OT Problem List:   OT Treatment Interventions:     OT Goals Acute Rehab OT Goals OT Goal Formulation: With patient Time For Goal Achievement: 01/07/12 Potential to Achieve Goals: Good ADL Goals Pt Will Perform Grooming: with supervision;Unsupported;Standing at sink (3 activities) ADL Goal: Grooming - Progress: Updated due to goal met Pt Will Perform Upper Body Bathing: with supervision;Sitting, chair;Sitting, edge of bed;Unsupported Pt Will Perform Lower Body Bathing: with supervision;Sit to stand from chair;Sit to stand from bed;Unsupported Pt Will Perform Upper Body Dressing: with supervision;Sitting, chair;Sitting, bed;Unsupported ADL Goal: Upper Body Dressing - Progress: Met Pt Will Perform Lower Body Dressing: with supervision;Sit to stand from chair;Sit to stand from bed ADL Goal: Lower Body Dressing - Progress: Progressing toward goals Pt Will Transfer to Toilet: with supervision;Ambulation;with DME;Comfort height toilet ADL Goal: Toilet Transfer - Progress: Progressing toward goals Miscellaneous OT Goals Miscellaneous OT Goal #1: Pt will participate in vision testing to determine level of visual impairment (if present). Miscellaneous OT Goal #2: Pt will perform static sitting balance task EOB >10 min with supervision. OT Goal: Miscellaneous Goal #2 - Progress: Progressing toward goals Miscellaneous OT Goal #3: Pt will independently perform RUE AAROM/AROM 2-3x daily. OT Goal: Miscellaneous Goal #3 - Progress: Met  Visit Information  Last OT Received On: 12/26/11 Assistance Needed: +1    Subjective Data      Prior Functioning       Cognition  Overall Cognitive Status: Appears within functional limits for tasks assessed/performed Arousal/Alertness: Awake/alert Orientation Level: Appears intact for tasks assessed Behavior During Session: [facility] for  tasks performed    Mobility  Shoulder  Instructions Bed Mobility Bed Mobility: Not assessed Transfers Sit to Stand: 4: Min guard;From chair/3-in-1;From toilet;With upper extremity assist Stand to Sit: 5: Supervision;With upper extremity assist;To chair/3-in-1;To toilet       Exercises      Balance Balance Balance Assessed: Yes Static Standing Balance Static Standing - Balance Support: Left upper extremity supported;During functional activity Static Standing - Level of Assistance: 5: Stand by assistance Static Standing - Comment/# of Minutes: 3   End of Session OT - End of Session Activity Tolerance: Patient tolerated treatment well Patient left: in chair;with call bell/phone within reach  GO     Evern Bio 12/26/2011, 10:17 AM [phone]

## 2011-12-26 NOTE — Progress Notes (Signed)
Discussed patient's progress with Mertie Clause, SPTA and agree with home with HHPT with 24/7 assist (pt reports his family can provide).  12/26/2011 Veda Canning, PT Pager: [phone]

## 2011-12-26 NOTE — Progress Notes (Signed)
Rehab Admissions Coordinator Note:  Patient was screened by Brock Ra for appropriateness for an Inpatient Acute Rehab Consult.   Note that pt already has an Inpatient Rehab consult.  Brock Ra 12/26/2011, 10:18 AM  I can be reached at [phone].

## 2011-12-26 NOTE — Clinical Documentation Improvement (Signed)
GENERIC DOCUMENTATION CLARIFICATION QUERY  THIS DOCUMENT IS NOT A PERMANENT PART OF THE MEDICAL RECORD  TO RESPOND TO THE THIS QUERY, FOLLOW THE INSTRUCTIONS BELOW:  1. If needed, update documentation for the patient's encounter via the notes activity.  2. Access this query again and click edit on the In [employer].  3. After updating, or not, click F2 to complete all highlighted (required) fields concerning your review. Select "additional documentation in the medical record" OR "no additional documentation provided".  4. Click Sign note button.  5. The deficiency will fall out of your In Basket *Please let us know if you are not able to complete this workflow by phone or e-mail (listed below).  Please update your documentation within the medical record to reflect your response to this query.                                                                                        12/26/11   Dear Dr. Suanne Marker  / Associates,  In a better effort to capture your patient's severity of illness, reflect appropriate length of stay and utilization of resources, a review of the patient medical record has revealed the following indicators.   PLEASE CLARIFY IN NOTES AND DC SUMMARY "RIGHT SIDED WEAKNESS" USING MEDICARE FRIENDLY TERMINOLOGY.  THANK YOU.  Possible Clinical Conditions? - Right hemiparesis - Other Condition (please specify)   Supporting Information: - Risk Factors: Acute ischemic stroke   - Signs & Symptoms: Neuro 12/1: "R.side strenght 5-/5, L5/5", 12/2:"mild residual right-sided weakness" with "3+/5 throughout R UE today", "Decreased FMC and mild ataxia RUE with FTN", "Sensation slightly diminished on right arm and hand", " Strength grossly is 4+ on the right side" - Treatment: Refer to CIR   You may use possible, probable, or suspect with inpatient documentation. possible, probable, suspected diagnoses MUST be documented at the time of discharge  Reviewed: additional  documentation in the medical record  Thank You,  Beverley Fiedler RN BSN Clinical Documentation Specialist: Tele:  [phone] Health Information Management [place]

## 2011-12-26 NOTE — Evaluation (Signed)
Speech Language Pathology Evaluation Patient Details Name: Joel Santiago MRN: [id number] DOB: 09-Sep-1940 Today's Date: 12/26/2011 Time: 1350-1410 SLP Time Calculation (min): 20 min  Problem List:  Patient Active Problem List  Diagnosis  . Non-STEMI (non-ST elevated myocardial infarction)  . CAD (coronary artery disease)  . Hyperlipidemia  . Type 2 diabetes mellitus with vascular disease  . Hypertensive heart disease without CHF  . Arterial ischemic stroke, multifocal, posterior circulation, acute   Past Medical History:  Past Medical History  Diagnosis Date  . Diabetes mellitus   . Hypertension   . Stroke   . Angina   . Coronary artery disease   . Hyperlipemia   . Cardiac angina    Past Surgical History:  Past Surgical History  Procedure Date  . Coronary artery bypass graft 03/01/2011    Procedure: CORONARY ARTERY BYPASS GRAFTING (CABG);  Surgeon: Alleen Borne, MD;  Location: [facility] OR;  Service: Open Heart Surgery;  Laterality: N/A;  Coronary artery bypass graft times five on pump using left internal mammary artery and right greater saphenous vein via endovein harvest.   HPI:  Joel Santiago is a 71 y.o. 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 Admitted 12/24/2011 with right-sided weakness as well as nausea with some dizziness. MRI of the brain showed small area of acute infarct in the left ventral and posterior medulla. 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 Echocardiogram with ejection fraction of 50% and grade 1 diastolic dysfunction. Carotid Dopplers with no ICA stenosis. Patient did not receive TPA. Neurology services consulted placed on aspirin therapy as well as subcutaneous heparin for DVT prophylaxis. Patient is tolerating a regular diet.   Assessment / Plan / Recommendation Clinical Impression  Pt presents with normal cognition  with complex verbal and functional tasks. Pt demonstrates very mild right labial weakness which may be at baseline (pt was not aware). Speech is fully intelligible though labial weakness does slightly impact articulation. No SLP f/u needed at this time. Will sign off.     SLP Assessment  Patient does not need any further Speech Lanaguage Pathology Services    Follow Up Recommendations       Frequency and Duration        Pertinent Vitals/Pain NA   SLP Goals     SLP Evaluation Prior Functioning  Cognitive/Linguistic Baseline: Within functional limits Type of Home: Apartment Lives With: Spouse Available Help at Discharge: Family;Available 24 hours/day Vocation: Retired   IT consultant  Overall Cognitive Status: Appears within functional limits for tasks assessed Arousal/Alertness: Awake/alert Orientation Level: Oriented X4 Attention: Focused;Sustained;Selective;Alternating Focused Attention: Appears intact Sustained Attention: Appears intact Selective Attention: Appears intact Alternating Attention: Appears intact Memory: Appears intact Awareness: Appears intact Problem Solving: Appears intact Executive Function: Reasoning;Sequencing;Organizing;Initiating;Self Monitoring Reasoning: Appears intact Sequencing: Appears intact Organizing: Appears intact Initiating: Appears intact Self Monitoring: Appears intact Safety/Judgment: Appears intact    Comprehension  Auditory Comprehension Overall Auditory Comprehension: Appears within functional limits for tasks assessed Reading Comprehension Reading Status: Within funtional limits    Expression Expression Primary Mode of Expression: Verbal Verbal Expression Overall Verbal Expression: Appears within functional limits for tasks assessed Initiation: No impairment Automatic Speech: Name;Social Response Level of Generative/Spontaneous Verbalization: Conversation Repetition: No impairment Naming: No impairment Pragmatics: No  impairment Written Expression Dominant Hand: Right Written Expression: Within Functional Limits   Oral / Motor Oral Motor/Sensory Function Overall Oral Motor/Sensory Function: Impaired at  baseline Labial ROM: Reduced right Labial Symmetry: Abnormal symmetry right Labial Strength: Reduced Labial Sensation: Within Functional Limits Lingual ROM: Within Functional Limits Lingual Symmetry: Within Functional Limits Lingual Strength: Within Functional Limits Lingual Sensation: Within Functional Limits Facial ROM: Within Functional Limits Facial Symmetry: Within Functional Limits Facial Strength: Within Functional Limits Facial Sensation: Within Functional Limits Velum: Within Functional Limits Mandible: Within Functional Limits Motor Speech Overall Motor Speech: Impaired at baseline Respiration: Within functional limits Phonation: Normal Resonance: Within functional limits Articulation: Impaired Level of Impairment: Conversation Intelligibility: Intelligible   GO    Harlon Ditty, MA CCC-SLP [phone]  Claudine Mouton 12/26/2011, 2:15 PM

## 2011-12-26 NOTE — Progress Notes (Addendum)
Patient discharge instructions, follow up appointments, and stroke education given to patient and family. Stroke Education Manual given and explained. All questions answered to satisfaction. Pt D/C home with no signs of acute distress.   Upon discharge, patient expressed he was out of BP medication, however he did not want to wait for Dr. Suanne Marker to write a prescription. Dr. Suanne Marker called back and sent the prescription to the patient's pharmacy. The patient was informed he could pick up his Rx for Metroprolol at the [facility] on [address] via phone at 1900.

## 2011-12-26 NOTE — Progress Notes (Signed)
   CARE MANAGEMENT NOTE 12/26/2011  Patient:  Joel Santiago, Joel Santiago   Account Number:  [id number]  Date Initiated:  12/26/2011  Documentation initiated by:  [facility]  Subjective/Objective Assessment:   CVA     Action/Plan:   Inpt Rehab, lives at home with wife.   Anticipated DC Date:  12/27/2011   Anticipated DC Plan:  IP REHAB FACILITY      DC Planning Services  CM consult      [facility] Choice  HOME HEALTH   Choice offered to / List presented to:  C-1 Patient   DME arranged  [facility] - [facility]ian      DME agency  [facility]     HH arranged  HH-2 PT  HH-3 OT      [facility] agency  [facility]   Status of service:  Completed, signed off Medicare Important Message given?   (If response is "NO", the following Medicare IM given date fields will be blank) Date Medicare IM given:   Date Additional Medicare IM given:    Discharge Disposition:  HOME W HOME HEALTH SERVICES  Per UR Regulation:    If discussed at Long Length of Stay Meetings, dates discussed:    Comments:  12/26/2011 1600 NCM spoke to pt. Offered choice for [facility]. Agreeable to [facility] for [facility]. Unit RN will follow up with [facility] DME rep for RW to be delivered to room before d/c. NCM contacted [facility] for [facility] for scheduled d/c home today. Added [facility] contact info to pt's d/c instructions. Isidoro Donning RN CCM Case Mgmt phone [phone]

## 2011-12-26 NOTE — Progress Notes (Signed)
Physical Therapy Treatment Patient Details Name: Joel Santiago MRN: [id number] DOB: October 30, 1940 Today's Date: 12/26/2011 Time: 7829-5621 PT Time Calculation (min): 29 min  PT Assessment / Plan / Recommendation Comments on Treatment Session  Pt. presents to be moving well when OOB with RW as evident in requiring decreased (A) with mobility at this date. Pt. attempted ambulation with SPC and presented with increased unsteadiness and increased (A) to execute ~39ft; therefore, switched to RW. Pt educated in use and reason for RW and agreed he was more steady with RW upon attempt. Pt. able to ascend.descend box step with (A) to mimic his home set up. Overall, pt has progressed to min guard level with transfers and ambulation and minor min (A) to execute box step. At this time, feel appropriate d/c to home with 24/7 (A)/supervision with HHPT. Pt. reports he will have adequate (A) at home.    Follow Up Recommendations  Home health PT;Supervision/Assistance - 24 hour     Does the patient have the potential to tolerate intense rehabilitation     Barriers to Discharge        Equipment Recommendations  Rolling walker with 5" wheels    Recommendations for Other Services    Frequency Min 4X/week   Plan Discharge plan needs to be updated;Frequency remains appropriate    Precautions / Restrictions Precautions Precautions: Fall Restrictions Weight Bearing Restrictions: No   Pertinent Vitals/Pain Patient denies pain    Mobility  Bed Mobility Bed Mobility: Not assessed Transfers Transfers: Sit to Stand;Stand to Sit Sit to Stand: 4: Min guard;With upper extremity assist;With armrests;From chair/3-in-1 Stand to Sit: 4: Min guard;With upper extremity assist;With armrests;To chair/3-in-1 Details for Transfer Assistance: Pt. min guard with transfers for safety and steadiness with min VC for proper hand placement of stepping to/sitting from RW.  Ambulation/Gait Ambulation/Gait Assistance: 4: Min  guard Ambulation Distance (Feet): 175 Feet Assistive device: Rolling walker;Straight cane Ambulation/Gait Assistance Details: Attempted ambulation with SPC; pt. with increased (A) needed and unsteadiness at which point pt. used RW. Pt. min guard with ambulation with RW for safety and steadiness.  Pt. given cues throughout ambulation to increase gt. speed and to look up from the floor. Pt. states that his Rt. LE was tired, but there were no presentations of buckling or LOB during ambulation. Would continue to recommend (A) for guarding pt. with ambulation.  Gait Pattern: Step-through pattern;Decreased stride length Gait velocity: slow gait speed Stairs: Yes Stairs Assistance: 4: Min assist Stairs Assistance Details (indicate cue type and reason): Pt. min (A) with executing box step in his room to mimic the small step pt. describes at his home. Pt. given (A) with ascent/descent to steady RW and to steady Rt LE upon descent. Pt. states someone at home would be able to help him with this task, continuning recommendation for (A) with stairs. Sequence for stepping up at home was written down for pt. Stair Management Technique: Forwards;With walker;Step to pattern Number of Stairs: 1  Wheelchair Mobility Wheelchair Mobility: No Modified Rankin (Stroke Patients Only) Pre-Morbid Rankin Score: No symptoms Modified Rankin: Moderately severe disability      PT Goals Acute Rehab PT Goals PT Goal Formulation: With patient Time For Goal Achievement: 01/07/12 Potential to Achieve Goals: Fair Pt will go Supine/Side to Sit: with modified independence Pt will go Sit to Supine/Side: with modified independence Pt will go Sit to Stand: with supervision PT Goal: Sit to Stand - Progress: Progressing toward goal Pt will go Stand to Sit: with supervision PT  Goal: Stand to Sit - Progress: Progressing toward goal Pt will Transfer Bed to Chair/Chair to Bed: with min assist Pt will Ambulate: with supervision;with  least restrictive assistive device;>150 feet PT Goal: Ambulate - Progress: Progressing toward goal  Visit Information  Last PT Received On: 12/26/11 Assistance Needed: +1    Subjective Data  Subjective: "I am doing good" Patient Stated Goal: to be back to normal   Cognition  Overall Cognitive Status: Appears within functional limits for tasks assessed/performed Arousal/Alertness: Awake/alert Orientation Level: Appears intact for tasks assessed Behavior During Session: [facility] for tasks performed    Balance     End of Session PT - End of Session Equipment Utilized During Treatment: Gait belt Activity Tolerance: Patient tolerated treatment well Patient left: in chair;with call bell/phone within reach Nurse Communication: Mobility status    Mertie Clause, SPTA 12/26/2011, 2:35 PM

## 2011-12-26 NOTE — Discharge Summary (Signed)
Physician Discharge Summary  Joel Santiago ZOX:[id number] DOB: [birth year] DOA: 12/24/2011  PCP: No primary provider on file.  Admit date: 12/24/2011 Discharge date: 12/26/2011  Time spent: <86minutes  Recommendations for Outpatient Follow-up:  1.      Follow-up Information    Follow up with Gates Rigg, MD. Schedule an appointment as soon as possible for a visit in 2 months. (stroke clinic)    Contact information:   [address], [address] [facility] [place] [address] [phone]       Please follow up. 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 Patient woke up this morning about 7 AM feeling okay, about 8 he started to have some nausea and lightheadedness, after that he went to the bathroom and had bowel movement for this is going to help him, this was actually worsened his symptoms. He became very unsteady he felt numbness in his arm about 9 AM. He called 911 and came to the hospital for further evaluation. Code stroke was called upon arrival to the emergency department, CT scan did not show any acute abnormalities. Abbreviated MRI was done showed multifocal stroke involving the posterior circulation. Patient was not given tPA because of his minimal symptoms. Patient is on aspirin and Brilinta. Patient will be admitted to the hospital for further  evaluation.   Hospital Course:  Acute ischemic stroke with right hemiparesis  -Multifocal involving the posterior circulation.  -Patient seen initially as code stroke by neurology, no recommendation for tPA because of minimal symptoms.  -Continue aspirin and Brilinta for his recent stents  -full MRI/MRA today still with Small area of acute infarct in the left ventral and posterior medulla, carotid duplex neg for ICA stenosis., 2-D echo with EF 45-50% hypokinesis basal inferior septal and distal inferior myocardium is noted that patient is status post non-STEMI in August of 2013(treated with a bare-metal stent and on aspirin and Brilinta -Neurology was consulted and followed patient, recommended keeping him on the aspirin and Brilinta, and okay for discharge from his standpoint today, and his to follow up outpatient with Dr. Priscille Heidelberg clinic in 2 months -PT/OT recommended CIR initially, and CIR evaluated patient today and recommended home health PT OT, and PT reevaluated patient and have recommended home health PT with rolling walker, and 24 7 supervision recommended -which wife will provide.   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 Laneta Simmers.  -He had NSTEMI in August of 2013 treated by [facility] and started on aspirin and Brilinta.  -He remained chest pain-free in the hospital, his cardiac enzymes were cycled and came back negative. -Continue dual antiplatelet therapy and metoprolol.  Hypertension  -Blood pressure was initially elevated upon arrival to the hospital likely secondary to the acute stroke.  -Patient also did not take his Medications on am of admit.  -continue home medicines, monitor and avoid hypotension in  setting of acute stroke.  Diabetes mellitus type 2  -Check hemoglobin A1c 7.3, continue insulin sliding scale. Holding metformin.   Consultants:  neuro Procedures:  ECHO pending   Study Conclusions  - Left ventricle: The cavity size was normal. Wall thickness was  normal. Systolic function was mildly reduced. The estimated ejection fraction was in the range of 45% to 50% in the setting of ventricular ectopy. There is hypokinesis of the basalinferoseptal myocardium. There is hypokinesis of the distalinferior myocardium. Doppler parameters are consistent with abnormal left ventricular relaxation (grade 1 diastolic dysfunction). - Aortic valve: Mildly calcified annulus. Trileaflet. - Mitral valve: Mildly thickened leaflets . Mild regurgitation, two distinct jets. - Left atrium: The atrium was mildly dilated. - Atrial septum: No defect or patent foramen ovale was identified. - Tricuspid valve: Mild regurgitation. - Pulmonary arteries: PA peak pressure: 41mm Hg (S). - Pericardium, extracardiac: There was no pericardial effusion. Transthoracic echocardiography.  Carotid duplex Preliminary report: Bilateral: No evidence of hemodynamically significant internal carotid artery stenosis. Vertebral artery flow is antegrade.      Discharge Exam: Filed Vitals:   12/25/11 2119 12/26/11 0300 12/26/11 0640 12/26/11 0928  BP: 144/67 159/82 156/75 140/73  Pulse: 77 66 71 77  Temp: 97.4 F (36.3 C) 97.7 F (36.5 C) 97.6 F (36.4 C) 97.6 F (36.4 C)  TempSrc: Oral Oral Oral Oral  Resp: 16 16 18 18   Height:      Weight:      SpO2: 98% 98% 97% 96%   Exam:  General: A&Ox3, in NAD  Cardiovascular:RRR  Respiratory: CTAB  Abdomen: soft +BS,NT/ND  Neuro:R.side strenght 5-/5, L5/5   Discharge Instructions  Discharge Orders    Future Orders Please Complete By Expires   Diet - low sodium heart healthy      Increase activity slowly          Medication List     As of 12/26/2011  3:59 PM    TAKE these medications         acetaminophen 500 MG tablet   Commonly known as: TYLENOL   Take 1,000 mg by mouth every 6 (six) hours as needed. For pain      aspirin 81 MG tablet   Take 81 mg by mouth daily.      atorvastatin 80 MG tablet   Commonly  known as: LIPITOR   Take 80 mg by mouth at bedtime.      fish oil-omega-3 fatty acids 1000 MG capsule   Take 1 capsule by mouth daily.      metFORMIN 1000 MG tablet   Commonly known as: GLUCOPHAGE   Take 1,000 mg by mouth 2 (two) times daily with a meal.      metoprolol 50 MG tablet   Commonly known as: LOPRESSOR   Take 25 mg by mouth 2 (two) times daily.      nitroGLYCERIN 0.4 MG SL tablet   Commonly known as: NITROSTAT   Place 1 tablet (0.4 mg total) under the tongue every 5 (five) minutes x 3 doses as needed for chest pain.      PRESCRIPTION MEDICATION   Take 1 tablet by mouth daily. Evacetrapib or placebo. Pt is on drug study.      Ticagrelor 90 MG Tabs tablet   Commonly known as: BRILINTA   Take 1 tablet (90 mg total) by mouth 2 (two) times daily.           Follow-up Information    Follow up with [facility]  P, MD. Schedule an appointment as soon as possible for a visit in 2 months. (stroke clinic)    Contact information:   [address], [address] [facility] [place] [address] [phone]       Please follow up. (PCP in 1-2weeks, call for appt upon discharge)           The results of significant diagnostics from this hospitalization (including imaging, microbiology, ancillary and laboratory) are listed below for reference.    Significant Diagnostic Studies: Dg Chest 2 View  12/24/2011  *RADIOLOGY REPORT*  Clinical Data: Stroke.  History of CABG.  CHEST - 2 VIEW  Comparison: Chest radiograph 08/26/2011  Findings: There are changes of median sternotomy for CABG. Mild cardiomegaly.  Cardiac leads project over the chest. Pulmonary vascularity appears mildly prominent.  The lungs are clear.  No airspace disease, effusion, or pneumothorax.  There is gaseous distention of the stomach, with an air-fluid level seen in the stomach on the lateral view.  No acute bony abnormality.  IMPRESSION:  1.  Mild cardiomegaly and mild pulmonary vascular  congestion. 2.  Visualized portion the stomach appears distended, and contains an air-fluid level.   Original Report Authenticated By: Britta Mccreedy, M.D.    Ct Head Wo Contrast  12/24/2011  *RADIOLOGY REPORT*  Clinical Data: Code stroke.  Patient with blurred vision, headache, and difficulty ambulating.  CT HEAD WITHOUT CONTRAST  Technique:  Contiguous axial images were obtained from the base of the skull through the vertex without contrast.  Comparison: No priors  Findings: The patient has fairly extensive bilateral chronic microvascular ischemic changes, with patchy areas of hypoattenuation in the periventricular white matter bilaterally, evidence of prior lacunar infarction in the right caudate head on image number 19, and hypoattenuation in the subinsular regions/basal ganglia bilaterally.  There is no evidence of acute cortically based infarction by CT.   Negative for hemorrhage, hydrocephalus, mass effect, or midline shift.  There is some mucosal thickening of the inferior left frontal sinus and anterior ethmoid air cells bilaterally.  Remainder the visualized paranasal sinuses and mastoid air cells are clear.  The skull is intact.  No evidence of scalp hematoma.  IMPRESSION:  1.  Extensive chronic white matter ischemic changes of prior lacunar infarctions. 2.  No acute intracranial abnormality is identified by CT. 3.  Chronic appearing ethmoid and inferior left frontal sinusitis.   Original Report Authenticated By: Britta Mccreedy, M.D.    Mr Brain Wo Contrast  12/25/2011  *RADIOLOGY REPORT*  Clinical Data:  Stroke  MRI HEAD WITHOUT CONTRAST MRA HEAD WITHOUT CONTRAST  Technique:  Multiplanar, multiecho pulse sequences of the brain and surrounding structures were obtained without intravenous contrast. Angiographic images of the head were obtained using MRA technique without contrast.  Comparison:  Limited MR head 12/24/2011  MRI HEAD  Findings:  Diffusion weighted imaging is positive for a small area of  acute infarct in the left ventral medulla.  This measures approximately 2 mm in size and is better seen on today's diffusion imaging.  There is also a small area of restricted diffusion in the left posterior medulla in the floor the ventricle, as noted on the study yesterday.  No other areas of acute infarct are present.  Moderate atrophy.  Moderate chronic microvascular ischemic change throughout the cerebral white matter bilaterally.  Chronic ischemic change in the pons.  Small chronic infarct right inferior cerebellum.  Negative for intracranial hemorrhage or mass.  Abnormal signal distal left vertebral artery which  may indicate severe stenosis or occlusion.  Based on the MRA this appears occluded.  IMPRESSION: Small area of acute infarct in the left ventral and posterior medulla, slightly better seen on today's study.  No other areas of acute infarct.  Atrophy and moderately severe chronic ischemic change.  MRA HEAD  Findings: Left vertebral artery ends in pica.  The post PICA segment is occluded.  Right vertebral artery is patent with mild stenosis distally.  There is atherosclerotic disease in the basilar with a mild to moderate stenosis in the mid basilar.  The superior cerebellar and posterior cerebral arteries are patent bilaterally with atherosclerotic irregularity.  There is a patent posterior communicating artery on the left.  Internal carotid artery is patent bilaterally with atherosclerotic irregularity but no significant stenosis.  Anterior and middle cerebral arteries are patent bilaterally with diffuse atherosclerotic irregularity.  Negative for aneurysm.  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   Original Report Authenticated By: Janeece Riggers, M.D.    Mr Brain Wo Contrast  12/24/2011  *RADIOLOGY REPORT*  Clinical Data: Code stroke  MRI HEAD WITHOUT CONTRAST  Technique:  Multiplanar, multiecho pulse sequences of the brain and surrounding  structures were obtained according to standard protocol without intravenous contrast.  Comparison: CT 12/24/2011  Findings: Diffusion weighted imaging only was obtained.  Small area of hyperintensity in the left posterior brainstem in the floor of the fourth ventricle.  This is seen on both 5 mm and 3 mm diffusion weighted imaging and is most likely a small area acute infarct.  Questionable area of acute infarct in the left middle cerebellar peduncle on the thin section diffusion.  No other definite acute infarct.  There is generalized atrophy.  IMPRESSION: Probable small area acute infarct in the floor of the fourth ventricle on the left.  Possible small acute infarct in the left middle cerebellar peduncle.  I discussed the findings by telephone with Dr. Amada Jupiter   Original Report Authenticated By: Janeece Riggers, M.D.    Mr Mra Head/brain Wo Cm  12/25/2011  *RADIOLOGY REPORT*  Clinical Data:  Stroke  MRI HEAD WITHOUT CONTRAST MRA HEAD WITHOUT CONTRAST  Technique:  Multiplanar, multiecho pulse sequences of the brain and surrounding structures were obtained without intravenous contrast. Angiographic images of the head were obtained using MRA technique without contrast.  Comparison:  Limited MR head 12/24/2011  MRI HEAD  Findings:  Diffusion weighted imaging is positive for a small area of acute infarct in the left ventral medulla.  This measures approximately 2 mm in size and is better seen on today's diffusion imaging.  There is also a small area of restricted diffusion in the left posterior medulla in the floor the ventricle, as noted on the study yesterday.  No other areas of acute infarct are present.  Moderate atrophy.  Moderate chronic microvascular ischemic change throughout the cerebral white matter bilaterally.  Chronic ischemic change in the pons.  Small chronic infarct right inferior cerebellum.  Negative for intracranial hemorrhage or mass.  Abnormal signal distal left vertebral artery which may  indicate severe stenosis or occlusion.  Based on the MRA this appears occluded.  IMPRESSION: Small area of acute infarct in the left ventral and posterior medulla, slightly better seen on today's study.  No other areas of acute infarct.  Atrophy and moderately severe chronic ischemic change.  MRA HEAD  Findings: Left vertebral artery ends in pica.  The post PICA segment is occluded.  Right vertebral artery is patent  with mild stenosis distally.  There is atherosclerotic disease in the basilar with a mild to moderate stenosis in the mid basilar.  The superior cerebellar and posterior cerebral arteries are patent bilaterally with atherosclerotic irregularity.  There is a patent posterior communicating artery on the left.  Internal carotid artery is patent bilaterally with atherosclerotic irregularity but no significant stenosis.  Anterior and middle cerebral arteries are patent bilaterally with diffuse atherosclerotic irregularity.  Negative for aneurysm.  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   Original Report Authenticated By: Janeece Riggers, M.D.     Microbiology: No results found for this or any previous visit (from the past 240 hour(s)).   Labs: Basic Metabolic Panel:  Lab 12/24/11 1610 12/24/11 0950  NA 141 137  K 3.6 3.6  CL 104 101  CO2 -- 25  GLUCOSE 267* 278*  BUN 7 8  CREATININE 0.80 0.65  CALCIUM -- 8.9  MG -- --  PHOS -- --   Liver Function Tests:  Lab 12/24/11 0950  AST 23  ALT 51  ALKPHOS 69  BILITOT 0.3  PROT 7.7  ALBUMIN 3.9   No results found for this basename: LIPASE:5,AMYLASE:5 in the last 168 hours No results found for this basename: AMMONIA:5 in the last 168 hours CBC:  Lab 12/24/11 1008 12/24/11 0950  WBC -- 5.6  NEUTROABS -- 3.0  HGB 14.3 13.2  HCT 42.0 41.4  MCV -- 88.1  PLT -- 186   Cardiac Enzymes:  Lab 12/24/11 1917 12/24/11 1333 12/24/11 0954  CKTOTAL -- -- --  CKMB -- -- --  CKMBINDEX  -- -- --  TROPONINI <0.30 <0.30 <0.30   BNP: BNP (last 3 results)  Basename 08/26/11 1430  PROBNP 1886.0*   CBG:  Lab 12/26/11 1105 12/26/11 0636 12/25/11 2208 12/25/11 1601 12/25/11 1128  GLUCAP 268* 166* 241* 179* 187*       Signed:  Lakayla Barrington C  [facility] 12/26/2011, 3:59 PM

## 2012-01-16 ENCOUNTER — Ambulatory Visit (INDEPENDENT_AMBULATORY_CARE_PROVIDER_SITE_OTHER): Payer: Medicare Other | Admitting: Physician Assistant

## 2012-01-16 VITALS — BP 130/68 | HR 48 | Resp 16 | Ht 72.0 in | Wt 195.0 lb

## 2012-01-16 DIAGNOSIS — Z951 Presence of aortocoronary bypass graft: Secondary | ICD-10-CM

## 2012-01-16 NOTE — Progress Notes (Signed)
  HPI:  Patient returns for wound check.  He is S/P CABG performed in 02/2011.  He states that his LLE stab incision has been raised and hard.  He states it has been that way for several months.  He denies drainage from the area, but states that occasionally it is very painful when he pulls socks up over this leg.    Current Outpatient Prescriptions  Medication Sig Dispense Refill  . acetaminophen (TYLENOL) 500 MG tablet Take 1,000 mg by mouth every 6 (six) hours as needed. For pain      . aspirin 81 MG tablet Take 81 mg by mouth daily.      Marland Kitchen atorvastatin (LIPITOR) 80 MG tablet Take 80 mg by mouth at bedtime.      . fish oil-omega-3 fatty acids 1000 MG capsule Take 1 capsule by mouth daily.       . metFORMIN (GLUCOPHAGE) 1000 MG tablet Take 1,000 mg by mouth 2 (two) times daily with a meal.       . metoprolol (LOPRESSOR) 50 MG tablet Take 0.5 tablets (25 mg total) by mouth 2 (two) times daily.  60 tablet  0  . nitroGLYCERIN (NITROSTAT) 0.4 MG SL tablet Place 1 tablet (0.4 mg total) under the tongue every 5 (five) minutes x 3 doses as needed for chest pain.  25 tablet  12  . PRESCRIPTION MEDICATION Take 1 tablet by mouth daily. Evacetrapib or placebo. Pt is on drug study.      . Ticagrelor (BRILINTA) 90 MG TABS tablet Take 1 tablet (90 mg total) by mouth 2 (two) times daily.  60 tablet  12    Physical Exam:  BP 130/68  Pulse 48  Resp 16  Ht 6' (1.829 m)  Wt 195 lb (88.451 kg)  BMI 26.45 kg/m2  SpO2 94%  Gen: no apparent distress Heart: RRR Lungs: CTA bilaterally Abd: Soft non-tender, non-distended Skin: LLE stab site has raised area with yellow punctated center  Impression:  Mr. Prajapati has a small raised area along the lower stab incision.  It did have an area of yellow area that could represent possible abscess or cyst formation.  Plan:  Attemped I/D without any expulsion of purulent material.  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  Patient  instructed to keep wound clean and dry.  Should wound develop purulent drainage he is to contact our office for wound check.  Otherwise he can return prn

## 2012-01-28 ENCOUNTER — Emergency Department (HOSPITAL_COMMUNITY): Payer: Medicare Other

## 2012-01-28 ENCOUNTER — Encounter (HOSPITAL_COMMUNITY): Payer: Self-pay | Admitting: *Deleted

## 2012-01-28 ENCOUNTER — Inpatient Hospital Stay (HOSPITAL_COMMUNITY)
Admission: EM | Admit: 2012-01-28 | Discharge: 2012-01-30 | DRG: 392 | Disposition: A | Discharge status: Home / Self Care | Payer: Medicare Other | Attending: Cardiology | Admitting: Cardiology

## 2012-01-28 ENCOUNTER — Other Ambulatory Visit: Payer: Self-pay

## 2012-01-28 DIAGNOSIS — I25810 Atherosclerosis of coronary artery bypass graft(s) without angina pectoris: Secondary | ICD-10-CM | POA: Diagnosis present

## 2012-01-28 DIAGNOSIS — R0789 Other chest pain: Secondary | ICD-10-CM | POA: Diagnosis present

## 2012-01-28 DIAGNOSIS — R079 Chest pain, unspecified: Secondary | ICD-10-CM

## 2012-01-28 DIAGNOSIS — Z79899 Other long term (current) drug therapy: Secondary | ICD-10-CM

## 2012-01-28 DIAGNOSIS — I10 Essential (primary) hypertension: Secondary | ICD-10-CM | POA: Diagnosis present

## 2012-01-28 DIAGNOSIS — F17200 Nicotine dependence, unspecified, uncomplicated: Secondary | ICD-10-CM | POA: Diagnosis present

## 2012-01-28 DIAGNOSIS — Z8673 Personal history of transient ischemic attack (TIA), and cerebral infarction without residual deficits: Secondary | ICD-10-CM

## 2012-01-28 DIAGNOSIS — I252 Old myocardial infarction: Secondary | ICD-10-CM

## 2012-01-28 DIAGNOSIS — I2510 Atherosclerotic heart disease of native coronary artery without angina pectoris: Secondary | ICD-10-CM | POA: Diagnosis present

## 2012-01-28 DIAGNOSIS — R29898 Other symptoms and signs involving the musculoskeletal system: Secondary | ICD-10-CM

## 2012-01-28 DIAGNOSIS — Z8249 Family history of ischemic heart disease and other diseases of the circulatory system: Secondary | ICD-10-CM

## 2012-01-28 DIAGNOSIS — R112 Nausea with vomiting, unspecified: Secondary | ICD-10-CM

## 2012-01-28 DIAGNOSIS — Z7902 Long term (current) use of antithrombotics/antiplatelets: Secondary | ICD-10-CM

## 2012-01-28 DIAGNOSIS — E119 Type 2 diabetes mellitus without complications: Secondary | ICD-10-CM | POA: Diagnosis present

## 2012-01-28 DIAGNOSIS — E785 Hyperlipidemia, unspecified: Secondary | ICD-10-CM | POA: Diagnosis present

## 2012-01-28 DIAGNOSIS — I119 Hypertensive heart disease without heart failure: Secondary | ICD-10-CM

## 2012-01-28 DIAGNOSIS — K219 Gastro-esophageal reflux disease without esophagitis: Principal | ICD-10-CM | POA: Diagnosis present

## 2012-01-28 DIAGNOSIS — Z7982 Long term (current) use of aspirin: Secondary | ICD-10-CM

## 2012-01-28 LAB — CBC WITH DIFFERENTIAL/PLATELET
Basophils Absolute: 0 10*3/uL (ref 0.0–0.1)
Eosinophils Absolute: 0 10*3/uL (ref 0.0–0.7)
Eosinophils Relative: 0 % (ref 0–5)
HCT: 40.5 % (ref 39.0–52.0)
Lymphocytes Relative: 15 % (ref 12–46)
MCH: 27.2 pg (ref 26.0–34.0)
MCHC: 31.1 g/dL (ref 30.0–36.0)
MCV: 87.3 fL (ref 78.0–100.0)
Monocytes Absolute: 0.6 10*3/uL (ref 0.1–1.0)
RDW: 13.5 % (ref 11.5–15.5)
WBC: 6.7 10*3/uL (ref 4.0–10.5)

## 2012-01-28 LAB — BASIC METABOLIC PANEL
CO2: 24 mEq/L (ref 19–32)
Calcium: 9.2 mg/dL (ref 8.4–10.5)
Creatinine, Ser: 0.64 mg/dL (ref 0.50–1.35)

## 2012-01-28 LAB — TROPONIN I: Troponin I: <0.3 ng/mL (ref <0.30)

## 2012-01-28 LAB — POCT I-STAT TROPONIN I

## 2012-01-28 MED ORDER — ONDANSETRON HCL 4 MG/2ML IJ SOLN
4.0000 mg | Freq: Once | INTRAMUSCULAR | Status: AC
  Administered 2012-01-28: 4 mg via INTRAVENOUS
  Filled 2012-01-28: qty 2

## 2012-01-28 MED ORDER — MORPHINE SULFATE 4 MG/ML IJ SOLN
4.0000 mg | Freq: Once | INTRAMUSCULAR | Status: AC
  Administered 2012-01-28: 4 mg via INTRAVENOUS
  Filled 2012-01-28: qty 1

## 2012-01-28 MED ORDER — NITROGLYCERIN 0.4 MG SL SUBL
0.4000 mg | SUBLINGUAL_TABLET | SUBLINGUAL | Status: AC | PRN
  Administered 2012-01-28 (×3): 0.4 mg via SUBLINGUAL

## 2012-01-28 MED ORDER — NITROGLYCERIN IN D5W 200-5 MCG/ML-% IV SOLN
10.0000 ug/min | INTRAVENOUS | Status: DC
  Administered 2012-01-28: 10 ug/min via INTRAVENOUS
  Filled 2012-01-28: qty 250

## 2012-01-28 MED ORDER — NITROGLYCERIN 2 % TD OINT
1.0000 [in_us] | TOPICAL_OINTMENT | Freq: Once | TRANSDERMAL | Status: AC
  Administered 2012-01-28: 1 [in_us] via TOPICAL
  Filled 2012-01-28: qty 1

## 2012-01-28 NOTE — ED Notes (Signed)
Lab reports the "troponin machine is down" and will be resulting in about 10 minutes

## 2012-01-28 NOTE — ED Notes (Signed)
Per EMS- pt has had chest tightness"all day" today. Pt reported to have had multiple episodes of vomitting, clammy, and intermittent dizzy. Pt had 324mg  of asprin prior to EMS arrival. BP 154/92. HR 88-104 with PVC.

## 2012-01-28 NOTE — ED Notes (Signed)
Pt had previous stroke with right sided weakness.

## 2012-01-28 NOTE — ED Provider Notes (Signed)
History     CSN: [id number]  Arrival date & time 01/28/12  1820   First MD Initiated Contact with Patient 01/28/12 1821      Chief Complaint  Patient presents with  . Chest Pain    (Consider location/radiation/quality/duration/timing/severity/associated sxs/prior treatment) Patient is a 72 y.o. male presenting with general illness. The history is provided by the patient. No language interpreter was used.  Illness  The current episode started today. The problem occurs continuously. The problem has been unchanged. The problem is moderate. Nothing relieves the symptoms. Nothing aggravates the symptoms. Associated symptoms include nausea and vomiting. Pertinent negatives include no fever, no abdominal pain, no constipation, no diarrhea, no congestion, no headaches, no sore throat, no cough and no rash.    Past Medical History  Diagnosis Date  . Diabetes mellitus   . Hypertension   . Stroke   . Angina   . Coronary artery disease   . Hyperlipemia   . Cardiac angina     Past Surgical History  Procedure Date  . Coronary artery bypass graft 03/01/2011    Procedure: CORONARY ARTERY BYPASS GRAFTING (CABG);  Surgeon: Alleen Borne, MD;  Location: [facility] OR;  Service: Open Heart Surgery;  Laterality: N/A;  Coronary artery bypass graft times five on pump using left internal mammary artery and right greater saphenous vein via endovein harvest.    Family History  Problem Relation Age of Onset  . CAD Father   . CAD Mother     History  Substance Use Topics  . Smoking status: Current Every Day Smoker -- 0.5 packs/day    Types: Cigarettes  . Smokeless tobacco: Never Used  . Alcohol Use: No      Review of Systems  Constitutional: Positive for diaphoresis. Negative for fever and chills.  HENT: Negative for congestion and sore throat.   Respiratory: Positive for shortness of breath. Negative for cough.   Cardiovascular: Positive for chest pain. Negative for leg swelling.    Gastrointestinal: Positive for nausea and vomiting. Negative for abdominal pain, diarrhea and constipation.  Genitourinary: Negative for dysuria and frequency.  Skin: Negative for color change and rash.  Neurological: Negative for dizziness and headaches.  Psychiatric/Behavioral: Negative for confusion and agitation.  All other systems reviewed and are negative.    Allergies  Review of patient's allergies indicates no known allergies.  Home Medications   Current Outpatient Rx  Name  Route  Sig  Dispense  Refill  . ACETAMINOPHEN 500 MG PO TABS   Oral   Take 1,000 mg by mouth every 6 (six) hours as needed. For pain         . ASPIRIN 81 MG PO TABS   Oral   Take 81 mg by mouth daily.         . ATORVASTATIN CALCIUM 80 MG PO TABS   Oral   Take 80 mg by mouth at bedtime.         . OMEGA-3 FATTY ACIDS 1000 MG PO CAPS   Oral   Take 1 capsule by mouth daily.          Marland Kitchen METFORMIN HCL 1000 MG PO TABS   Oral   Take 1,000 mg by mouth 2 (two) times daily with a meal.          . METOPROLOL TARTRATE 50 MG PO TABS   Oral   Take 0.5 tablets (25 mg total) by mouth 2 (two) times daily.   60 tablet   0   .  NITROGLYCERIN 0.4 MG SL SUBL   Sublingual   Place 1 tablet (0.4 mg total) under the tongue every 5 (five) minutes x 3 doses as needed for chest pain.   25 tablet   12   . PRESCRIPTION MEDICATION   Oral   Take 1 tablet by mouth daily. Evacetrapib or placebo. Pt is on drug study.         Marland Kitchen TICAGRELOR 90 MG PO TABS   Oral   Take 1 tablet (90 mg total) by mouth 2 (two) times daily.   60 tablet   12     There were no vitals taken for this visit.  Physical Exam  Constitutional: He is oriented to person, place, and time. He appears well-developed and well-nourished. No distress.  HENT:  Head: Normocephalic and atraumatic.  Eyes: EOM are normal. Pupils are equal, round, and reactive to light.  Neck: Normal range of motion. Neck supple. No JVD present.   Cardiovascular: Normal rate and regular rhythm.   Pulses:      Radial pulses are 2+ on the right side, and 2+ on the left side.  Pulmonary/Chest: Effort normal and breath sounds normal. No respiratory distress.  Abdominal: Soft. He exhibits no distension.  Musculoskeletal: Normal range of motion. He exhibits no edema.       Right lower leg: He exhibits no swelling.       Left lower leg: He exhibits no swelling.  Neurological: He is alert and oriented to person, place, and time.  Skin: Skin is warm and dry.  Psychiatric: He has a normal mood and affect. His behavior is normal.    ED Course  Procedures (including critical care time)  Labs Reviewed - No data to display No results found. Results for orders placed during the hospital encounter of 01/28/12  CBC WITH DIFFERENTIAL      Component Value Range   WBC 6.7  4.0 - 10.5 K/uL   RBC 4.64  4.22 - 5.81 MIL/uL   Hemoglobin 12.6 (*) 13.0 - 17.0 g/dL   HCT 21.3  08.6 - 57.8 %   MCV 87.3  78.0 - 100.0 fL   MCH 27.2  26.0 - 34.0 pg   MCHC 31.1  30.0 - 36.0 g/dL   RDW 46.9  62.9 - 52.8 %   Platelets 171  150 - 400 K/uL   Neutrophils Relative 76  43 - 77 %   Neutro Abs 5.0  1.7 - 7.7 K/uL   Lymphocytes Relative 15  12 - 46 %   Lymphs Abs 1.0  0.7 - 4.0 K/uL   Monocytes Relative 10  3 - 12 %   Monocytes Absolute 0.6  0.1 - 1.0 K/uL   Eosinophils Relative 0  0 - 5 %   Eosinophils Absolute 0.0  0.0 - 0.7 K/uL   Basophils Relative 0  0 - 1 %   Basophils Absolute 0.0  0.0 - 0.1 K/uL  BASIC METABOLIC PANEL      Component Value Range   Sodium 137  135 - 145 mEq/L   Potassium 4.2  3.5 - 5.1 mEq/L   Chloride 98  96 - 112 mEq/L   CO2 24  19 - 32 mEq/L   Glucose, Bld 201 (*) 70 - 99 mg/dL   BUN 8  6 - 23 mg/dL   Creatinine, Ser 4.13  0.50 - 1.35 mg/dL   Calcium 9.2  8.4 - 24.4 mg/dL   GFR calc non Af Amer >90  >90  mL/min   GFR calc Af Amer >90  >90 mL/min  TROPONIN I      Component Value Range   Troponin I <0.30  <0.30 ng/mL  POCT  I-STAT TROPONIN I      Component Value Range   Troponin i, poc 0.00  0.00 - 0.08 ng/mL   Comment 3             CT Head Wo Contrast (Final result)   Result time:01/28/12 2106    Final result by Rad Results In Interface (01/28/12 21:06:10)    Narrative:   *RADIOLOGY REPORT*  Clinical Data: Chest tightness and pain. Intermittent dizziness. Multiple episodes of vomiting.  CT HEAD WITHOUT CONTRAST  Technique: Contiguous axial images were obtained from the base of the skull through the vertex without contrast.  Comparison: Previous MR CT examinations.  Findings: Stable enlarged ventricles and subarachnoid spaces. No significant change in patchy white matter low density in both cerebral hemispheres and old bilateral lacunar infarcts, including the left basal ganglia and anterior limb internal capsule and right caudate head. No intracranial hemorrhage, mass lesion or CT evidence of acute infarction. Unremarkable bones and included paranasal sinuses.  IMPRESSION:  1. No acute abnormality. 2. Stable atrophy, chronic small vessel white matter ischemic changes and old lacunar infarcts.   Original Report Authenticated By: Beckie Salts, M.D.             DG Chest 2 View (Final result)   Result time:01/28/12 [phone]    Final result by Rad Results In Interface (01/28/12 19:34:40)    Narrative:   *RADIOLOGY REPORT*  Clinical Data: Smoker with chest pain, cough and chest congestion.  CHEST - 2 VIEW  Comparison: 12/24/2011.  Findings: The cardiac silhouette is borderline enlarged with a mild decrease in size. Stable post CABG changes. Clear lungs. Mildly prominent pulmonary vasculature with improvement. Mild thoracic spine degenerative changes.  IMPRESSION:  1. No acute abnormality. 2. Improved cardiomegaly and pulmonary vascular congestion.   Original Report Authenticated By: Beckie Salts, M.D.     Date: 01/29/2012  Rate: 100  Rhythm: sinus tachycardia  QRS Axis:  normal  Intervals: normal  ST/T Wave abnormalities: normal  Conduction Disutrbances:none  Narrative Interpretation: frequent ectopy - bigeminy   Old EKG Reviewed: changes noted     No diagnosis found.    MDM  Pt w/ Hx of CAD s/p CABG and stent placement now w/ acute onset chest pain. States sx started today - awoke him from sleep. Substernal pressure, radiating to right arm and head. A/w dyspnea, nausea and diaphoresis. States sx feel similar to prior MI. Pain has been constant, initially 10/10, now 8/10. Has already taken ASA 325mg . Pt also admits to HA and worsening of his right sided deficit from remote CVA.   Exam: normotensive, pulse 93, not hypoxic or in resp distress, appears in pain and acutely vomiting. GCS 15, lungs CTAB, heart RRR no m/g/r. No pulse deficit, LE edema or JVD.  4/5 strength in RUE and RLE, 5/5 on left. Pt states this is worse than his baseline deficit.    DDx: concern for ACS and possible CVA. Based on hx and exam doubt spont ptx, tamponade, pneumonia, boerhaave, dissection.  Plan: will check CXR, CT head, cbc, bmp, troponin and ECG. Will give nitro x 3 and morphine  Course: reassessed, vitals stable, pain now 6/10 - placed on nitro paste and pain improved to 4/10, placed on nitro gtt and pain resolved, CXR NACPF, CT head - NAICA, troponin  neg, labs unremarkable, ECG w/out acute ischemia - does reveal frequent ectopy. D/w cardiology and pt admitted for chest pain rule out. Admit in stable condition.   1. Chest pain   2. Upper extremity weakness   3. Lower extremity weakness   4. Nausea and vomiting      Audelia Hives, MD 01/29/12 [phone]

## 2012-01-28 NOTE — H&P (Addendum)
Admit date: 01/28/2012 Referring Physician Dr. Gary Fleet Primary Cardiologist Dr. Armanda Magic Chief complaint/reason for admission: Chest pain  HPI: This is a 72yo BM with a history of CAD s/p CABG and NSTEMI 08/2011 with cath at that time showing occluded SVG to OM and occluded SVG to RCA with patent SVG to diagonal and Patent LIMA to LAD.  He underwent PCI of the native left circ and has done well since until this am when he awakened with chest pain.  The CP is described as pressure with radiation into the right arm.  He denied SOB but did have diaphoresis and nausea and vomited.  He called EMS and was given 4 baby ASA and NTG x 3 SL which did not help his pain.  In the ER he was placed on a NTG gtt and given morphine and pain resolved but now he complains of 3/10 CP.    PMH:    Past Medical History  Diagnosis Date  . Diabetes mellitus   . Hypertension   . Stroke   . Angina   . Coronary artery diseases s/p CABG 02/2011 - s/p NSTEMI with acutely occluded SVG to OM and occluded SVG to RCA s/p PCI of native left circ 08/2011   . Hyperlipemia   . Cardiac angina     PSH:    Past Surgical History  Procedure Date  . Coronary artery bypass graft 03/01/2011    Procedure: CORONARY ARTERY BYPASS GRAFTING (CABG);  Surgeon: Alleen Borne, MD;  Location: [facility] OR;  Service: Open Heart Surgery;  Laterality: N/A;  Coronary artery bypass graft times five on pump using left internal mammary artery and right greater saphenous vein via endovein harvest.    ALLERGIES:   Review of patient's allergies indicates no known allergies.  Prior to Admit Meds:   (Not in a hospital admission) Family HX:    Family History  Problem Relation Age of Onset  . CAD Father   . CAD Mother    Social HX:    History   Social History  . Marital Status: Married    Spouse Name: N/A    Number of Children: N/A  . Years of Education: N/A   Occupational History  . Not on file.   Social History Main Topics  . Smoking status:  Current Every Day Smoker -- 0.5 packs/day    Types: Cigarettes  . Smokeless tobacco: Never Used  . Alcohol Use: No  . Drug Use: No  . Sexually Active: Yes   Other Topics Concern  . Not on file   Social History Narrative  . No narrative on file     ROS:  All 11 ROS were addressed and are negative except what is stated in the HPI  PHYSICAL EXAM Filed Vitals:   01/28/12 2200  BP: 128/75  Pulse: 86  Resp: 11   General: Well developed, well nourished, in no acute distress Head: Eyes PERRLA, No xanthomas.   Normal cephalic and atramatic  Lungs:   Clear bilaterally to auscultation and percussion. Heart:   HRRR S1 S2 Pulses are 2+ & equal.            No carotid bruit. No JVD.  No abdominal bruits. No femoral bruits. Abdomen: Bowel sounds are positive, abdomen soft and non-tender without masses Extremities:   No clubbing, cyanosis or edema.  DP +1 Neuro: Alert and oriented X 3. Psych:  Good affect, responds appropriately   Labs:   Lab Results  Component Value Date  WBC 6.7 01/28/2012   HGB 12.6* 01/28/2012   HCT 40.5 01/28/2012   MCV 87.3 01/28/2012   PLT 171 01/28/2012    Lab 01/28/12 1745  NA 137  K 4.2  CL 98  CO2 24  BUN 8  CREATININE 0.64  CALCIUM 9.2  PROT --  BILITOT --  ALKPHOS --  ALT --  AST --  GLUCOSE 201*   Lab Results  Component Value Date   CKTOTAL 1084* 08/27/2011   CKMB 61.6* 08/27/2011   TROPONINI <0.30 01/28/2012   No results found for this basename: PTT   Lab Results  Component Value Date   INR 1.03 12/24/2011   INR 2.00* 08/26/2011   INR 1.08 08/26/2011     Lab Results  Component Value Date   CHOL 128 12/25/2011   CHOL 134 08/27/2011   CHOL 200 02/27/2011   Lab Results  Component Value Date   HDL 39* 12/25/2011   HDL 38* 08/27/2011   HDL 45 4/0/9811   Lab Results  Component Value Date   LDLCALC 71 12/25/2011   LDLCALC 75 08/27/2011   LDLCALC 138* 02/27/2011   Lab Results  Component Value Date   TRIG 89 12/25/2011   TRIG 107 08/27/2011   TRIG 87  02/27/2011   Lab Results  Component Value Date   CHOLHDL 3.3 12/25/2011   CHOLHDL 3.5 08/27/2011   CHOLHDL 4.4 02/27/2011   No results found for this basename: LDLDIRECT      Radiology:  *RADIOLOGY REPORT*  Clinical Data: Smoker with chest pain, cough and chest congestion.  CHEST - 2 VIEW  Comparison: 12/24/2011.  Findings: The cardiac silhouette is borderline enlarged with a mild  decrease in size. Stable post CABG changes. Clear lungs. Mildly  prominent pulmonary vasculature with improvement. Mild thoracic  spine degenerative changes.  IMPRESSION:  1. No acute abnormality.  2. Improved cardiomegaly and pulmonary vascular congestion.  Original Report Authenticated By: Beckie Salts, M.D.  EKG:  NSR with bigeminal PVC's and old inferior infarct  ASSESSMENT:  1.  Unstable angina with negative troponin x 1.  EKG with no acute ischemic changes.  CP relieved by morphine not NTG - ? GI etiology - patient is having a lot of belching and nausea. 2.  Bigeminal PVC's 3.  CAD s/p CABG 02/2011 followed by NSTEMI 08/2011 with occluded SVG to OM and SVG to RCA s/p PCI of native left circ 4.  HTN 5.  Dyslipidemia 6.  DM  PLAN:   1.  Admit to step down bed 2.  Cycle cardiac enzymes 3.  IV Heparin gtt per pharmacy 4.  IV NTG gtt  5.  Continue ASA/ Brilinta/statin/beta blocker 6.  GI cocktail to see if symptoms improve 7.  Further workup pending results of cardiac enzymes  Quintella Reichert, MD  01/28/2012  10:50 PM

## 2012-01-28 NOTE — ED Notes (Signed)
Patient transported to X-ray 

## 2012-01-28 NOTE — ED Provider Notes (Addendum)
Patient complains of anterior chest pain nonradiating onset upon awakening 7 AM today pain is constant nothing makes symptoms better or worse. Associated symptoms include nausea no shortness of breath no sweatiness. He was treated with 4 baby aspirin to coming here did not take nitroglycerin as he is states he had. Patient is presently a muscular 1-10  9 PM pain and nausea has resolved patient asymptomatic after treatment with intravenous nitroglycerin, morphine, Zofran  Suspect unstable angina Results for orders placed during the hospital encounter of 01/28/12  CBC WITH DIFFERENTIAL      Component Value Range   WBC 6.7  4.0 - 10.5 K/uL   RBC 4.64  4.22 - 5.81 MIL/uL   Hemoglobin 12.6 (*) 13.0 - 17.0 g/dL   HCT 56.2  13.0 - 86.5 %   MCV 87.3  78.0 - 100.0 fL   MCH 27.2  26.0 - 34.0 pg   MCHC 31.1  30.0 - 36.0 g/dL   RDW 78.4  69.6 - 29.5 %   Platelets 171  150 - 400 K/uL   Neutrophils Relative 76  43 - 77 %   Neutro Abs 5.0  1.7 - 7.7 K/uL   Lymphocytes Relative 15  12 - 46 %   Lymphs Abs 1.0  0.7 - 4.0 K/uL   Monocytes Relative 10  3 - 12 %   Monocytes Absolute 0.6  0.1 - 1.0 K/uL   Eosinophils Relative 0  0 - 5 %   Eosinophils Absolute 0.0  0.0 - 0.7 K/uL   Basophils Relative 0  0 - 1 %   Basophils Absolute 0.0  0.0 - 0.1 K/uL  BASIC METABOLIC PANEL      Component Value Range   Sodium 137  135 - 145 mEq/L   Potassium 4.2  3.5 - 5.1 mEq/L   Chloride 98  96 - 112 mEq/L   CO2 24  19 - 32 mEq/L   Glucose, Bld 201 (*) 70 - 99 mg/dL   BUN 8  6 - 23 mg/dL   Creatinine, Ser 2.84  0.50 - 1.35 mg/dL   Calcium 9.2  8.4 - 13.2 mg/dL   GFR calc non Af Amer >90  >90 mL/min   GFR calc Af Amer >90  >90 mL/min   Dg Chest 2 View  01/28/2012  *RADIOLOGY REPORT*  Clinical Data: Smoker with chest pain, cough and chest congestion.  CHEST - 2 VIEW  Comparison: 12/24/2011.  Findings: The cardiac silhouette is borderline enlarged with a mild decrease in size.  Stable post CABG changes.  Clear lungs.   Mildly prominent pulmonary vasculature with improvement.  Mild thoracic spine degenerative changes.  IMPRESSION:  1.  No acute abnormality. 2.  Improved cardiomegaly and pulmonary vascular congestion.   Original Report Authenticated By: Beckie Salts, M.D.      Doug Sou, MD 01/28/12 4401  Doug Sou, MD 01/29/12 [phone]

## 2012-01-28 NOTE — ED Notes (Signed)
Spoke with lab regarding delay in troponin. Will have draw troponin at this time due to specimen in lab expiring. Phlebotomy made aware and drawing now.

## 2012-01-28 NOTE — ED Notes (Signed)
Attempted to call report. Floor RN unable to accept report.  

## 2012-01-29 ENCOUNTER — Encounter (HOSPITAL_COMMUNITY): Payer: Self-pay | Admitting: *Deleted

## 2012-01-29 LAB — CBC WITH DIFFERENTIAL/PLATELET
Eosinophils Absolute: 0 10*3/uL (ref 0.0–0.7)
Eosinophils Relative: 0 % (ref 0–5)
HCT: 39.5 % (ref 39.0–52.0)
Hemoglobin: 12.1 g/dL — ABNORMAL LOW (ref 13.0–17.0)
Lymphocytes Relative: 13 % (ref 12–46)
Lymphs Abs: 1 10*3/uL (ref 0.7–4.0)
MCH: 26.9 pg (ref 26.0–34.0)
MCV: 88 fL (ref 78.0–100.0)
Monocytes Relative: 9 % (ref 3–12)
Platelets: 180 10*3/uL (ref 150–400)
RBC: 4.49 MIL/uL (ref 4.22–5.81)
WBC: 7.6 10*3/uL (ref 4.0–10.5)

## 2012-01-29 LAB — GLUCOSE, CAPILLARY: Glucose-Capillary: 81 mg/dL (ref 70–99)

## 2012-01-29 LAB — COMPREHENSIVE METABOLIC PANEL
ALT: 34 U/L (ref 0–53)
AST: 18 U/L (ref 0–37)
CO2: 23 mEq/L (ref 19–32)
Calcium: 8.7 mg/dL (ref 8.4–10.5)
Chloride: 97 mEq/L (ref 96–112)
Creatinine, Ser: 0.6 mg/dL (ref 0.50–1.35)
GFR calc Af Amer: >90 mL/min (ref >90)
GFR calc non Af Amer: >90 mL/min (ref >90)
Glucose, Bld: 203 mg/dL — ABNORMAL HIGH (ref 70–99)
Total Bilirubin: 0.3 mg/dL (ref 0.3–1.2)

## 2012-01-29 LAB — LIPID PANEL
Cholesterol: 136 mg/dL (ref 0–200)
HDL: 45 mg/dL (ref >39)
Triglycerides: 54 mg/dL (ref <150)

## 2012-01-29 LAB — TSH: TSH: 0.207 u[IU]/mL — ABNORMAL LOW (ref 0.350–4.500)

## 2012-01-29 LAB — CBC
MCHC: 31.8 g/dL (ref 30.0–36.0)
Platelets: 206 10*3/uL (ref 150–400)
RDW: 13.6 % (ref 11.5–15.5)
WBC: 9.2 10*3/uL (ref 4.0–10.5)

## 2012-01-29 LAB — BASIC METABOLIC PANEL
BUN: 9 mg/dL (ref 6–23)
Creatinine, Ser: 0.7 mg/dL (ref 0.50–1.35)
GFR calc Af Amer: >90 mL/min (ref >90)
GFR calc non Af Amer: >90 mL/min (ref >90)
Potassium: 3.7 mEq/L (ref 3.5–5.1)

## 2012-01-29 LAB — TROPONIN I: Troponin I: <0.3 ng/mL (ref <0.30)

## 2012-01-29 MED ORDER — ASPIRIN 81 MG PO CHEW
81.0000 mg | CHEWABLE_TABLET | Freq: Every day | ORAL | Status: DC
  Administered 2012-01-29 – 2012-01-30 (×2): 81 mg via ORAL
  Filled 2012-01-29 (×2): qty 1

## 2012-01-29 MED ORDER — HEPARIN BOLUS VIA INFUSION
4000.0000 [IU] | Freq: Once | INTRAVENOUS | Status: AC
  Administered 2012-01-29: 4000 [IU] via INTRAVENOUS
  Filled 2012-01-29: qty 4000

## 2012-01-29 MED ORDER — TICAGRELOR 90 MG PO TABS
90.0000 mg | ORAL_TABLET | Freq: Two times a day (BID) | ORAL | Status: DC
  Administered 2012-01-29 – 2012-01-30 (×4): 90 mg via ORAL
  Filled 2012-01-29 (×5): qty 1

## 2012-01-29 MED ORDER — HEPARIN (PORCINE) IN NACL 100-0.45 UNIT/ML-% IJ SOLN
1400.0000 [IU]/h | INTRAMUSCULAR | Status: DC
  Administered 2012-01-29: 1400 [IU]/h via INTRAVENOUS
  Filled 2012-01-29 (×4): qty 250

## 2012-01-29 MED ORDER — ACETAMINOPHEN 325 MG PO TABS: 650.0000 mg | ORAL_TABLET | ORAL | Status: DC | PRN

## 2012-01-29 MED ORDER — NITROGLYCERIN 0.4 MG SL SUBL: 0.4000 mg | SUBLINGUAL_TABLET | SUBLINGUAL | Status: DC | PRN

## 2012-01-29 MED ORDER — METOPROLOL TARTRATE 25 MG PO TABS
25.0000 mg | ORAL_TABLET | Freq: Two times a day (BID) | ORAL | Status: DC
  Administered 2012-01-29 – 2012-01-30 (×4): 25 mg via ORAL
  Filled 2012-01-29 (×5): qty 1

## 2012-01-29 MED ORDER — ONDANSETRON HCL 4 MG/2ML IJ SOLN: 4.0000 mg | Freq: Four times a day (QID) | INTRAMUSCULAR | Status: DC | PRN

## 2012-01-29 MED ORDER — METFORMIN HCL 500 MG PO TABS
1000.0000 mg | ORAL_TABLET | Freq: Two times a day (BID) | ORAL | Status: DC
  Administered 2012-01-29 – 2012-01-30 (×3): 1000 mg via ORAL
  Filled 2012-01-29 (×5): qty 2

## 2012-01-29 MED ORDER — SODIUM CHLORIDE 0.9 % IV SOLN: INTRAVENOUS | Status: DC

## 2012-01-29 MED ORDER — ATORVASTATIN CALCIUM 80 MG PO TABS
80.0000 mg | ORAL_TABLET | Freq: Every day | ORAL | Status: DC
  Administered 2012-01-29 (×2): 80 mg via ORAL
  Filled 2012-01-29 (×3): qty 1

## 2012-01-29 MED ORDER — ENOXAPARIN SODIUM 40 MG/0.4ML ~~LOC~~ SOLN
40.0000 mg | SUBCUTANEOUS | Status: DC
  Administered 2012-01-29: 40 mg via SUBCUTANEOUS
  Filled 2012-01-29 (×2): qty 0.4

## 2012-01-29 MED ORDER — NITROGLYCERIN IN D5W 200-5 MCG/ML-% IV SOLN: 3.0000 ug/min | INTRAVENOUS | Status: DC

## 2012-01-29 NOTE — Progress Notes (Signed)
Subjective:  Admitted with chest tightness, but also headache and nausea.  Had head CT  Objective:  Vital Signs in the last 24 hours: BP 150/85  Pulse 83  Temp 98.6 F (37 C) (Oral)  Resp 14  Ht 6' (1.829 m)  Wt 85.9 kg (189 lb 6 oz)  BMI 25.68 kg/m2  SpO2 98%  Physical Exam: Anxious BM in NAD Lungs:  Clear Cardiac:  Regular rhythm, normal S1 and S2, no S3 Abdomen:  Soft, nontender, no masses Extremities:  No edema present  Intake/Output from previous day: 01/04 0701 - 01/05 0700 In: 391.3 [P.O.:240; I.V.:151.3] Out: 1100 [Urine:1100]  Weight Filed Weights   01/28/12 2339 01/29/12 0030  Weight: 88.451 kg (195 lb) 85.9 kg (189 lb 6 oz)    Lab Results: Basic Metabolic Panel:  Basename 01/29/12 0450 01/29/12 0041  NA 138 135  K 3.7 4.2  CL 101 97  CO2 26 23  GLUCOSE 177* 203*  BUN 9 9  CREATININE 0.70 0.60   CBC:  Basename 01/29/12 0041 01/28/12 1745  WBC 7.6 6.7  NEUTROABS 5.9 5.0  HGB 12.1* 12.6*  HCT 39.5 40.5  MCV 88.0 87.3  PLT 180 171   Cardiac Enzymes:  Basename 01/29/12 0450 01/28/12 2338 01/28/12 2120  CKTOTAL -- -- --  CKMB -- -- --  CKMBINDEX -- -- --  TROPONINI <0.30 <0.30 <0.30    Telemetry: Sinus rhytthm  Assessment/Plan: 1. Nondescript symptoms with negative enzymes 2. CAD with prior CABG and PCI with DES of native circ 3. Headache resolved  Rec:  With negative enzymes, will stop IV heparin and NTG and ambulate.  IF recurrent symptoms will need repeat cath.    Darden Palmer  MD [facility] Cardiology  01/29/2012, 9:29 AM

## 2012-01-29 NOTE — Progress Notes (Signed)
ANTICOAGULATION CONSULT NOTE - Initial Consult  Pharmacy Consult for heparin Indication: chest pain/ACS/USAP  No Known Allergies  Patient Measurements: Height: 6' (182.9 cm) Weight: 195 lb (88.451 kg) IBW/kg (Calculated) : 77.6   Vital Signs: BP: 127/86 mmHg (01/04 2330) Pulse Rate: 83  (01/04 2330)  Labs:  Basename 01/28/12 2120 01/28/12 1745  HGB -- 12.6*  HCT -- 40.5  PLT -- 171  APTT -- --  LABPROT -- --  INR -- --  HEPARINUNFRC -- --  CREATININE -- 0.64  CKTOTAL -- --  CKMB -- --  TROPONINI <0.30 --    Estimated Creatinine Clearance: 91.6 ml/min (by C-G formula based on Cr of 0.64).   Medical History: Past Medical History  Diagnosis Date  . Diabetes mellitus   . Hypertension   . Stroke   . Angina   . Coronary artery disease   . Hyperlipemia   . Cardiac angina     Medications:  Prescriptions prior to admission  Medication Sig Dispense Refill  . acetaminophen (TYLENOL) 500 MG tablet Take 1,000 mg by mouth every 6 (six) hours as needed. For pain      . aspirin 81 MG tablet Take 81 mg by mouth daily.      Marland Kitchen atorvastatin (LIPITOR) 80 MG tablet Take 80 mg by mouth at bedtime.      . fish oil-omega-3 fatty acids 1000 MG capsule Take 1 capsule by mouth daily.       . metFORMIN (GLUCOPHAGE) 1000 MG tablet Take 1,000 mg by mouth 2 (two) times daily with a meal.       . metoprolol (LOPRESSOR) 50 MG tablet Take 0.5 tablets (25 mg total) by mouth 2 (two) times daily.  60 tablet  0  . nitroGLYCERIN (NITROSTAT) 0.4 MG SL tablet Place 1 tablet (0.4 mg total) under the tongue every 5 (five) minutes x 3 doses as needed for chest pain.  25 tablet  12  . PRESCRIPTION MEDICATION Take 1 tablet by mouth daily. Evacetrapib or placebo. Pt is on drug study.      . Ticagrelor (BRILINTA) 90 MG TABS tablet Take 1 tablet (90 mg total) by mouth 2 (two) times daily.  60 tablet  12   Scheduled:    . aspirin  81 mg Oral Daily  . atorvastatin  80 mg Oral QHS  . heparin  4,000 Units  Intravenous Once  . metFORMIN  1,000 mg Oral BID WC  . metoprolol  25 mg Oral BID  . [COMPLETED]  morphine injection  4 mg Intravenous Once  . [COMPLETED] nitroGLYCERIN  1 inch Topical Once  . [COMPLETED] ondansetron (ZOFRAN) IV  4 mg Intravenous Once  . Ticagrelor  90 mg Oral BID    Assessment: 72yo male s/p NSTEMI/CABG <45mo ago c/o CP with radiation to right arm associated with diaphoresis and N/V, NTG did not relieve pain though morphine did, to begin heparin for USAP.  Goal of Therapy:  Heparin level 0.3-0.7 units/ml Monitor platelets by anticoagulation protocol: Yes   Plan:  Will give heparin 4000 units IV bolus x1 followed by gtt at 1400 units/hr (pt required rate up to 2100 units/hr within last year so will start higher than usual starting point) and monitor heparin levels and CBC.  Colleen Can PharmD BCPS 01/29/2012,12:47 AM

## 2012-01-29 NOTE — ED Provider Notes (Signed)
I have personally seen and examined the patient.  I have discussed the plan of care with the resident.  I have reviewed the documentation on PMH/FH/Soc. History.  I have reviewed the documentation of the resident and agree.  Doug Sou, MD 01/29/12 [phone]

## 2012-01-30 DIAGNOSIS — R079 Chest pain, unspecified: Secondary | ICD-10-CM

## 2012-01-30 LAB — CBC
HCT: 40.5 % (ref 39.0–52.0)
Hemoglobin: 12.9 g/dL — ABNORMAL LOW (ref 13.0–17.0)
RBC: 4.59 MIL/uL (ref 4.22–5.81)
RDW: 13.7 % (ref 11.5–15.5)
WBC: 6.8 10*3/uL (ref 4.0–10.5)

## 2012-01-30 LAB — GLUCOSE, CAPILLARY: Glucose-Capillary: 126 mg/dL — ABNORMAL HIGH (ref 70–99)

## 2012-01-30 NOTE — Discharge Planning (Addendum)
Patient ID:  Joel Santiago  MRN: [id number]  DOB/AGE: 73/28/1942 72 y.o.  Admit date: 01/28/2012  Discharge date: 01/30/2012  Primary Discharge Diagnosis Chest pain noncardiac most likely related to GERD  Secondary Discharge Diagnosis  DM  HTN  CVA  CAD  Dyslipidemia  Consults: None  Hospital Course:  This is a 72yo BM with a history of CAD s/p CABG and NSTEMI 08/2011 with cath at that time showing occluded SVG to OM and occluded SVG to RCA with patent SVG to diagonal and Patent LIMA to LAD. He underwent PCI of the native left circ and has done well until the am of admission when he awakened with chest pain. The CP was described as pressure with radiation into the right arm. He denied SOB but did have diaphoresis and nausea and vomited. He called EMS and was given 4 baby ASA and NTG x 3 SL which did not help his pain. He has had a lot of belching and hiccups recently. He rule out for MI by serial enzymes. EKG showed on ischemia. He had no further chest pain and on the day of discharge demanded to go home. He was ambulating on day of d/c with no problems. He was discharged to home in stable condition and will be set up for outpatient nuclear stress test. Of note he was found to have a low TSH which will be worked up by his primary MD as an outpt.  Discharge Exam:  General appearance: alert  Resp: clear to auscultation bilaterally  Cardio: regular rate and rhythm, S1, S2 normal, no murmur, click, rub or gallop  GI: soft, non-tender; bowel sounds normal; no masses, no organomegaly  Extremities: extremities normal, atraumatic, no cyanosis or edema  Labs:  Lab Results   Component  Value  Date    WBC  6.8  01/30/2012    HGB  12.9*  01/30/2012    HCT  40.5  01/30/2012    MCV  88.2  01/30/2012    PLT  207  01/30/2012    Lab  01/29/12 0450  01/29/12 0041   NA  138  --   K  3.7  --   CL  101  --   CO2  26  --   BUN  9  --   CREATININE  0.70  --   CALCIUM  8.8  --   PROT  --  7.4   BILITOT  --  0.3     ALKPHOS  --  65   ALT  --  34   AST  --  18   GLUCOSE  177*  --    Lab Results   Component  Value  Date    CKTOTAL  1084*  08/27/2011    CKMB  61.6*  08/27/2011    TROPONINI  <0.30  01/29/2012    Lab Results   Component  Value  Date    CHOL  136  01/29/2012    CHOL  128  12/25/2011    CHOL  134  08/27/2011    Lab Results   Component  Value  Date    HDL  45  01/29/2012    HDL  39*  12/25/2011    HDL  38*  08/27/2011    Lab Results   Component  Value  Date    LDLCALC  80  01/29/2012    LDLCALC  71  12/25/2011    LDLCALC  75  08/27/2011    Lab Results     Component  Value  Date    TRIG  54  01/29/2012    TRIG  89  12/25/2011    TRIG  107  08/27/2011    Lab Results   Component  Value  Date    CHOLHDL  3.0  01/29/2012    CHOLHDL  3.3  12/25/2011    CHOLHDL  3.5  08/27/2011    No results found for this basename: LDLDIRECT    Radiology: RADIOLOGY REPORT*  Clinical Data: Smoker with chest pain, cough and chest congestion.  CHEST - 2 VIEW  Comparison: 12/24/2011.  Findings: The cardiac silhouette is borderline enlarged with a mild  decrease in size. Stable post CABG changes. Clear lungs. Mildly  prominent pulmonary vasculature with improvement. Mild thoracic  spine degenerative changes.  IMPRESSION:  1. No acute abnormality.  2. Improved cardiomegaly and pulmonary vascular congestion.  Original Report Authenticated By: Steven Reid, M.D.  EKG: NSR with old inferior infarct  FOLLOW UP PLANS AND APPOINTMENTS  Discharge Orders    Future Orders  Please Complete By  Expires    Diet - low sodium heart healthy      Increase activity slowly          Medication List      As of 01/30/2012 12:44 PM     TAKE these medications        acetaminophen 500 MG tablet     Commonly known as: TYLENOL     Take 1,000 mg by mouth every 6 (six) hours as needed. For pain     aspirin 81 MG tablet     Take 81 mg by mouth daily.     atorvastatin 80 MG tablet     Commonly known as: LIPITOR     Take 80 mg by mouth  at bedtime.     fish oil-omega-3 fatty acids 1000 MG capsule     Take 1 capsule by mouth daily.     metFORMIN 1000 MG tablet     Commonly known as: GLUCOPHAGE     Take 1,000 mg by mouth 2 (two) times daily with a meal.     metoprolol 50 MG tablet     Commonly known as: LOPRESSOR     Take 0.5 tablets (25 mg total) by mouth 2 (two) times daily.     nitroGLYCERIN 0.4 MG SL tablet     Commonly known as: NITROSTAT     Place 1 tablet (0.4 mg total) under the tongue every 5 (five) minutes x 3 doses as needed for chest pain.     PRESCRIPTION MEDICATION     Take 1 tablet by mouth daily. Evacetrapib or placebo. Pt is on drug study.     Ticagrelor 90 MG Tabs tablet     Commonly known as: BRILINTA     Take 1 tablet (90 mg total) by mouth 2 (two) times daily.          Follow-up Information    Follow up with FERGUSON,CYNTHIA A, NP. On 02/14/2012. (at 9:30am)    Contact information:    [facility]  [address], [address]  [place] [address]  [phone]         BRING ALL MEDICATIONS WITH YOU TO FOLLOW UP APPOINTMENTS  Time spent with patient to include physician time 30 minutes  Signed:  TURNER,TRACI R  01/30/2012, 12:44 PM   

## 2012-01-31 ENCOUNTER — Other Ambulatory Visit (HOSPITAL_COMMUNITY): Payer: Self-pay | Admitting: Cardiology

## 2012-01-31 ENCOUNTER — Ambulatory Visit: Payer: Medicare Other | Admitting: Surgery

## 2012-01-31 DIAGNOSIS — R079 Chest pain, unspecified: Secondary | ICD-10-CM

## 2012-01-31 NOTE — Discharge Summary (Signed)
Patient ID:  Joel Santiago  MRN: [id number]  DOB/AGE: 73-Aug-1942 73 y.o.  Admit date: 01/28/2012  Discharge date: 01/30/2012  Primary Discharge Diagnosis Chest pain noncardiac most likely related to GERD  Secondary Discharge Diagnosis  DM  HTN  CVA  CAD  Dyslipidemia  Consults: None  Hospital Course:  This is a 72yo BM with a history of CAD s/p CABG and NSTEMI 08/2011 with cath at that time showing occluded SVG to OM and occluded SVG to RCA with patent SVG to diagonal and Patent LIMA to LAD. He underwent PCI of the native left circ and has done well until the am of admission when he awakened with chest pain. The CP was described as pressure with radiation into the right arm. He denied SOB but did have diaphoresis and nausea and vomited. He called EMS and was given 4 baby ASA and NTG x 3 SL which did not help his pain. He has had a lot of belching and hiccups recently. He rule out for MI by serial enzymes. EKG showed on ischemia. He had no further chest pain and on the day of discharge demanded to go home. He was ambulating on day of d/c with no problems. He was discharged to home in stable condition and will be set up for outpatient nuclear stress test. Of note he was found to have a low TSH which will be worked up by his primary MD as an outpt.  Discharge Exam:  General appearance: alert  Resp: clear to auscultation bilaterally  Cardio: regular rate and rhythm, S1, S2 normal, no murmur, click, rub or gallop  GI: soft, non-tender; bowel sounds normal; no masses, no organomegaly  Extremities: extremities normal, atraumatic, no cyanosis or edema  Labs:  Lab Results   Component  Value  Date    WBC  6.8  01/30/2012    HGB  12.9*  01/30/2012    HCT  40.5  01/30/2012    MCV  88.2  01/30/2012    PLT  207  01/30/2012    Lab  01/29/12 0450  01/29/12 0041   NA  138  --   K  3.7  --   CL  101  --   CO2  26  --   BUN  9  --   CREATININE  0.70  --   CALCIUM  8.8  --   PROT  --  7.4   BILITOT  --  0.3     ALKPHOS  --  65   ALT  --  34   AST  --  18   GLUCOSE  177*  --    Lab Results   Component  Value  Date    CKTOTAL  1084*  08/27/2011    CKMB  61.6*  08/27/2011    TROPONINI  <0.30  01/29/2012    Lab Results   Component  Value  Date    CHOL  136  01/29/2012    CHOL  128  12/25/2011    CHOL  134  08/27/2011    Lab Results   Component  Value  Date    HDL  45  01/29/2012    HDL  39*  12/25/2011    HDL  38*  08/27/2011    Lab Results   Component  Value  Date    LDLCALC  80  01/29/2012    LDLCALC  71  12/25/2011    LDLCALC  75  08/27/2011    Lab Results  Component  Value  Date    TRIG  54  01/29/2012    TRIG  89  12/25/2011    TRIG  107  08/27/2011    Lab Results   Component  Value  Date    CHOLHDL  3.0  01/29/2012    CHOLHDL  3.3  12/25/2011    CHOLHDL  3.5  08/27/2011    No results found for this basename: LDLDIRECT    Radiology: RADIOLOGY REPORT*  Clinical Data: Smoker with chest pain, cough and chest congestion.  CHEST - 2 VIEW  Comparison: 12/24/2011.  Findings: The cardiac silhouette is borderline enlarged with a mild  decrease in size. Stable post CABG changes. Clear lungs. Mildly  prominent pulmonary vasculature with improvement. Mild thoracic  spine degenerative changes.  IMPRESSION:  1. No acute abnormality.  2. Improved cardiomegaly and pulmonary vascular congestion.  Original Report Authenticated By: Beckie Salts, M.D.  EKG: NSR with old inferior infarct  FOLLOW UP PLANS AND APPOINTMENTS  Discharge Orders    Future Orders  Please Complete By  Expires    Diet - low sodium heart healthy      Increase activity slowly          Medication List      As of 01/30/2012 12:44 PM     TAKE these medications        acetaminophen 500 MG tablet     Commonly known as: TYLENOL     Take 1,000 mg by mouth every 6 (six) hours as needed. For pain     aspirin 81 MG tablet     Take 81 mg by mouth daily.     atorvastatin 80 MG tablet     Commonly known as: LIPITOR     Take 80 mg by mouth  at bedtime.     fish oil-omega-3 fatty acids 1000 MG capsule     Take 1 capsule by mouth daily.     metFORMIN 1000 MG tablet     Commonly known as: GLUCOPHAGE     Take 1,000 mg by mouth 2 (two) times daily with a meal.     metoprolol 50 MG tablet     Commonly known as: LOPRESSOR     Take 0.5 tablets (25 mg total) by mouth 2 (two) times daily.     nitroGLYCERIN 0.4 MG SL tablet     Commonly known as: NITROSTAT     Place 1 tablet (0.4 mg total) under the tongue every 5 (five) minutes x 3 doses as needed for chest pain.     PRESCRIPTION MEDICATION     Take 1 tablet by mouth daily. Evacetrapib or placebo. Pt is on drug study.     Ticagrelor 90 MG Tabs tablet     Commonly known as: BRILINTA     Take 1 tablet (90 mg total) by mouth 2 (two) times daily.          Follow-up Information    Follow up with FERGUSON,CYNTHIA A, NP. On 02/14/2012. (at 9:30am)    Contact information:    [facility]  [address], [address]  [place] [address]  [phone]         BRING ALL MEDICATIONS WITH YOU TO FOLLOW UP APPOINTMENTS  Time spent with patient to include physician time 30 minutes  Signed:  Quintella Reichert  01/30/2012, 12:44 PM

## 2012-02-07 ENCOUNTER — Encounter (HOSPITAL_COMMUNITY): Payer: Self-pay

## 2012-06-06 ENCOUNTER — Ambulatory Visit: Payer: Self-pay | Admitting: Neurology

## 2012-09-19 ENCOUNTER — Ambulatory Visit: Payer: Medicare Other | Attending: Family Medicine | Admitting: Physical Therapy

## 2012-09-19 DIAGNOSIS — IMO0001 Reserved for inherently not codable concepts without codable children: Secondary | ICD-10-CM | POA: Insufficient documentation

## 2012-09-19 DIAGNOSIS — M545 Low back pain, unspecified: Secondary | ICD-10-CM | POA: Insufficient documentation

## 2012-09-19 DIAGNOSIS — M79609 Pain in unspecified limb: Secondary | ICD-10-CM | POA: Insufficient documentation

## 2012-09-20 ENCOUNTER — Ambulatory Visit: Payer: Medicare Other | Admitting: Physical Therapy

## 2012-09-25 ENCOUNTER — Ambulatory Visit: Payer: Medicare Other | Attending: Family Medicine | Admitting: Physical Therapy

## 2012-09-25 DIAGNOSIS — M545 Low back pain, unspecified: Secondary | ICD-10-CM | POA: Insufficient documentation

## 2012-09-25 DIAGNOSIS — M79609 Pain in unspecified limb: Secondary | ICD-10-CM | POA: Insufficient documentation

## 2012-09-25 DIAGNOSIS — IMO0001 Reserved for inherently not codable concepts without codable children: Secondary | ICD-10-CM | POA: Insufficient documentation

## 2012-09-27 ENCOUNTER — Ambulatory Visit: Payer: Medicare Other | Admitting: Physical Therapy

## 2012-10-02 ENCOUNTER — Ambulatory Visit: Payer: Medicare Other | Admitting: Physical Therapy

## 2012-10-04 ENCOUNTER — Ambulatory Visit: Payer: Medicare Other | Admitting: Physical Therapy

## 2013-01-11 ENCOUNTER — Encounter: Payer: Self-pay | Admitting: Pharmacist

## 2013-04-01 IMAGING — CT CT HEAD W/O CM
1 of 2 series · 13 of 30 positions shown, 17 images · non-contrast
Comparison: Previous MR CT examinations.

CLINICAL DATA: Chest tightness and pain.  Intermittent dizziness.
Multiple episodes of vomiting.

CT HEAD WITHOUT CONTRAST
TECHNIQUE: Contiguous axial images were obtained from the base of
the skull through the vertex without contrast.

[Series 2: brain · axial · 0.47mm/px · z∈[+89,+213]mm · 13 of 28 slices shown, 17 images]
[im 2/28  brain]
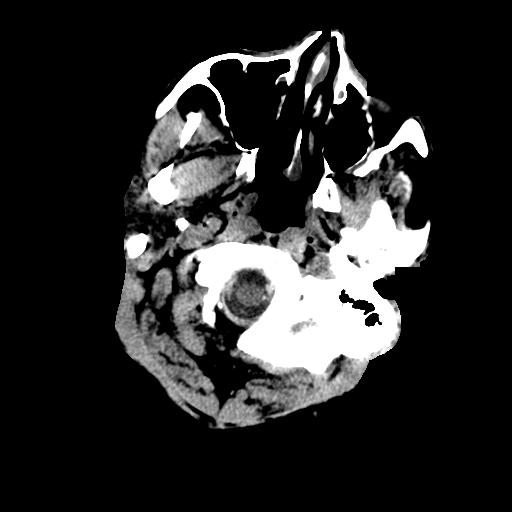
[im 2/28  bone]
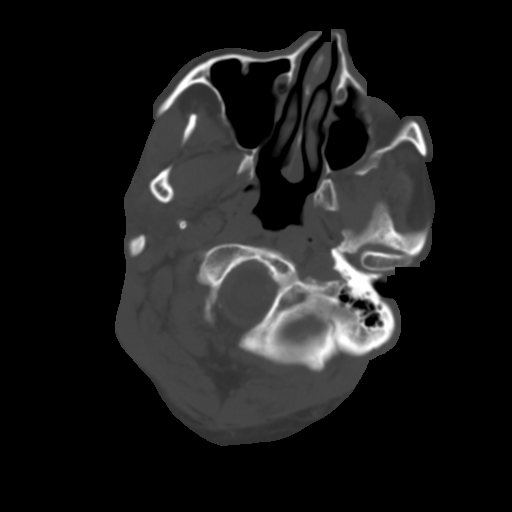
[im 4/28  brain]
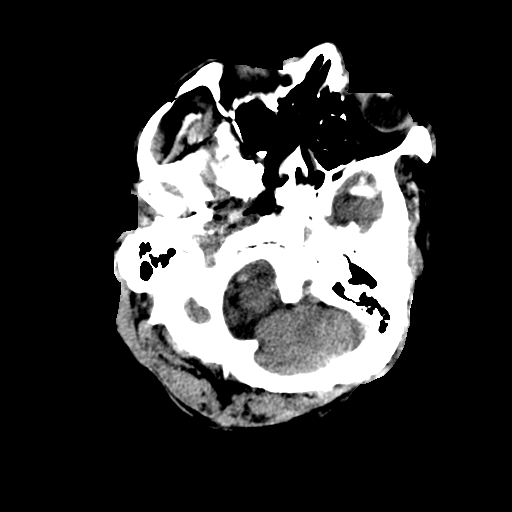
[im 6/28  brain]
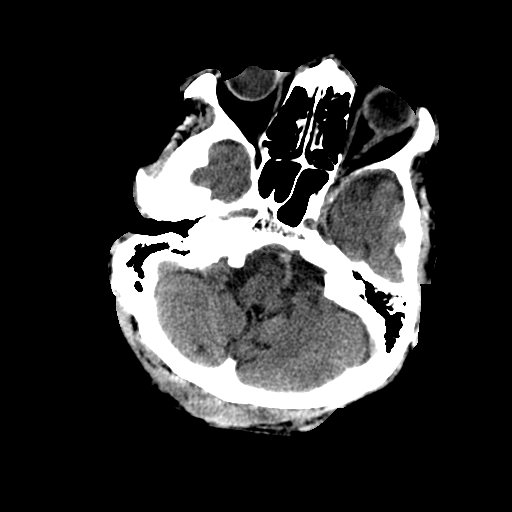
[im 8/28  brain]
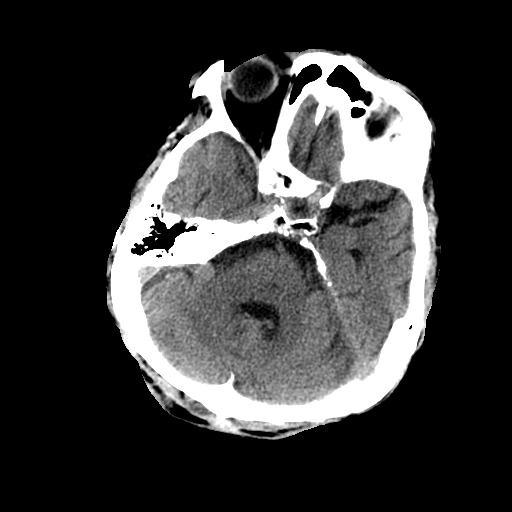
[im 10/28  brain]
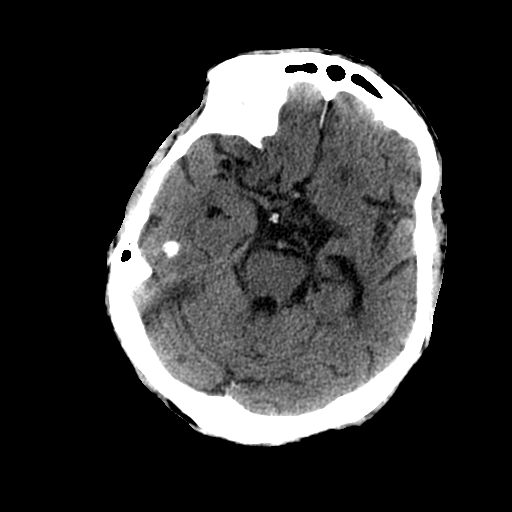
[im 10/28  bone]
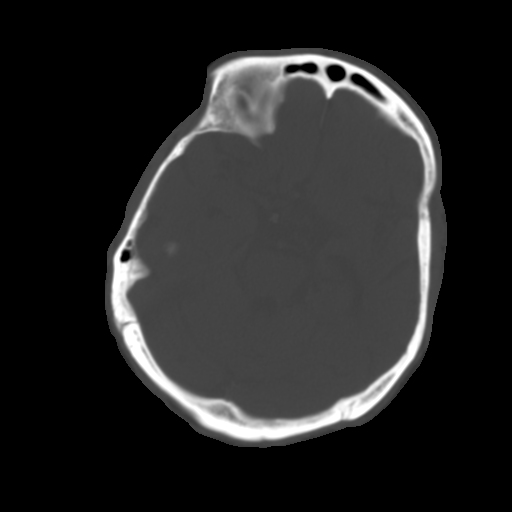
[im 12/28  brain]
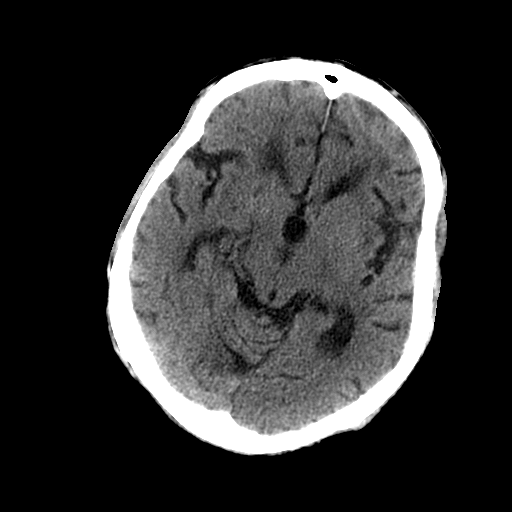
[im 14/28  brain]
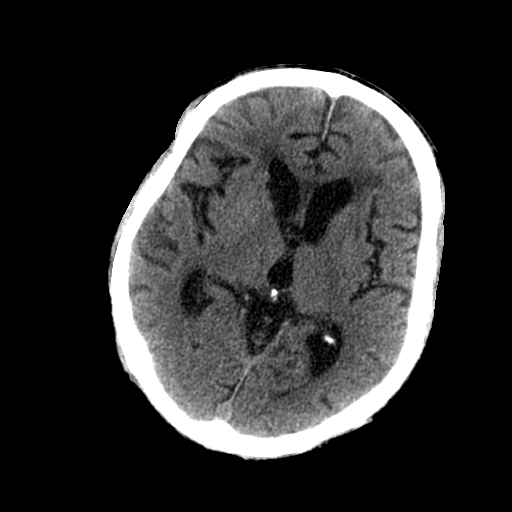
[im 16/28  brain]
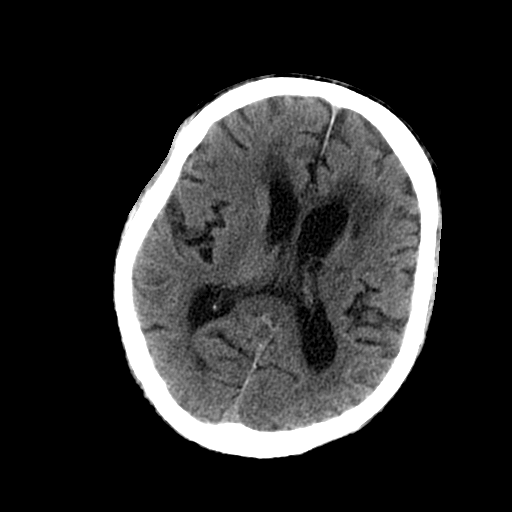
[im 18/28  brain]
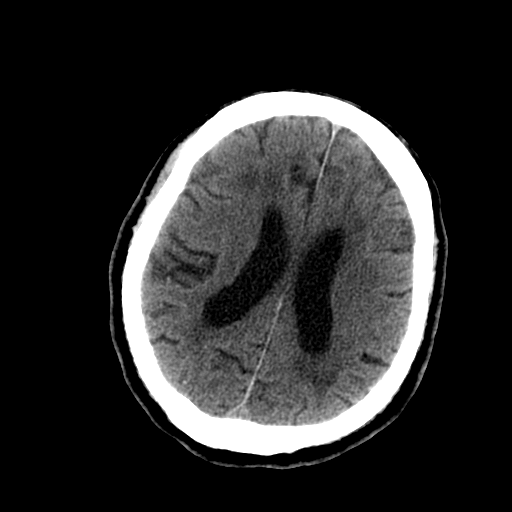
[im 18/28  bone]
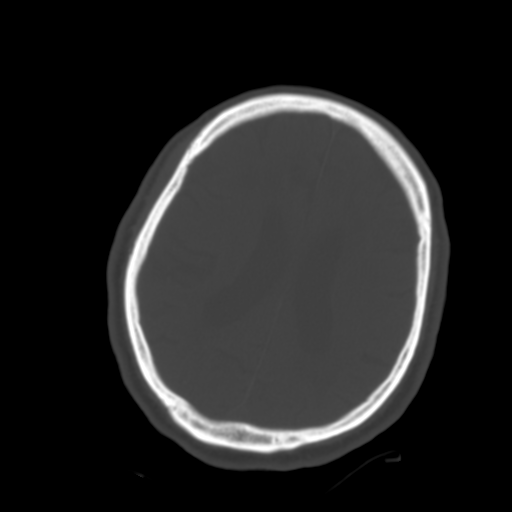
[im 20/28  brain]
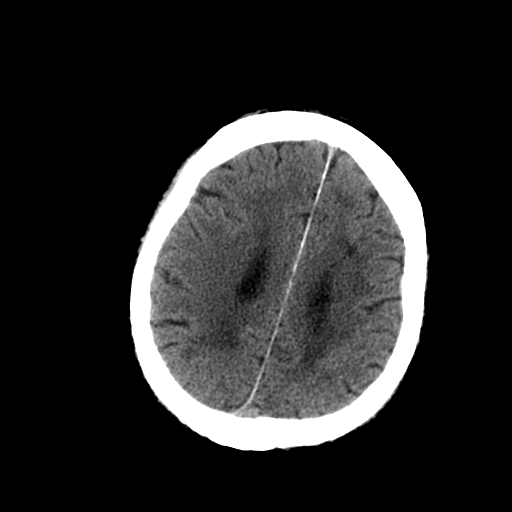
[im 22/28  brain]
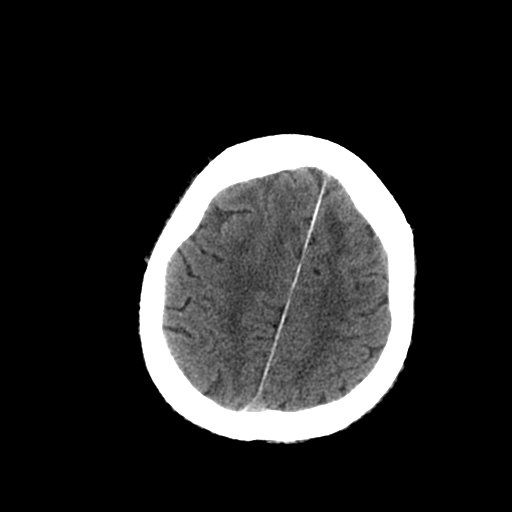
[im 24/28  brain]
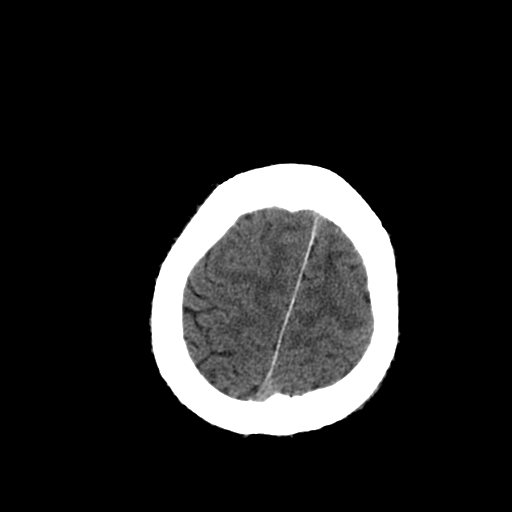
[im 26/28  brain]
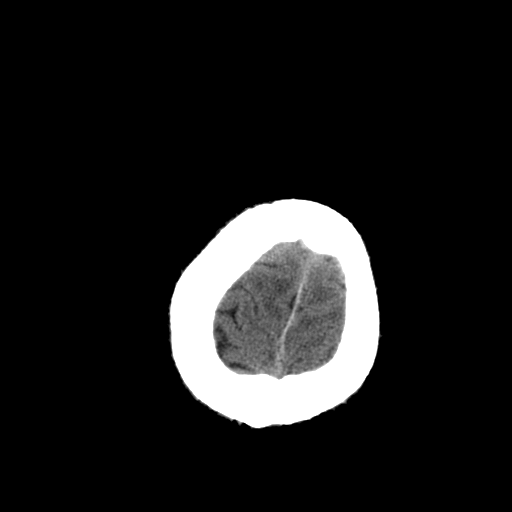
[im 26/28  bone]
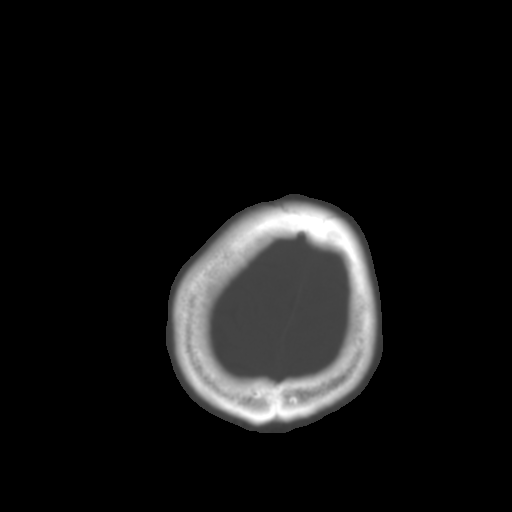

[13 of 30 positions shown; findings below may reference images not displayed]

FINDINGS: Stable enlarged ventricles and subarachnoid spaces.  No
significant change in patchy white matter low density in both
cerebral hemispheres and old bilateral lacunar infarcts, including
the left basal ganglia and anterior limb internal capsule and right
caudate head.  No intracranial hemorrhage, mass lesion or CT
evidence of acute infarction.  Unremarkable bones and included
paranasal sinuses.
IMPRESSION: 1.  No acute abnormality.
2.  Stable atrophy, chronic small vessel white matter ischemic
changes and old lacunar infarcts.

## 2013-12-16 ENCOUNTER — Telehealth: Payer: Self-pay | Admitting: Cardiology

## 2013-12-16 DIAGNOSIS — E785 Hyperlipidemia, unspecified: Secondary | ICD-10-CM

## 2013-12-16 NOTE — Telephone Encounter (Signed)
The patient just completed a lipid drug study.  It was recommended by Dr. Dayton ScrapeMurray that patient have FLP and ALT checked in 3 months.  Please set up.

## 2013-12-18 NOTE — Telephone Encounter (Signed)
Left message to call back to supply patient's correct phone number.

## 2014-01-02 ENCOUNTER — Encounter (HOSPITAL_COMMUNITY): Payer: Self-pay | Admitting: Cardiology

## 2014-01-03 ENCOUNTER — Encounter: Payer: Self-pay | Admitting: *Deleted

## 2014-01-03 NOTE — Telephone Encounter (Signed)
Attempted to call the patient's contact # in the chart and his wife's alternate #. Primary contact states the phone belongs to "Becky." The alternate # listed in the chart for the patient's wife is not taking incoming calls. A letter has been mailed to the patient's home address to have him contact our office.

## 2014-01-13 ENCOUNTER — Encounter: Payer: Self-pay | Admitting: Cardiology

## 2014-01-13 NOTE — Telephone Encounter (Signed)
Fasting lab appointment scheduled for April 07, 2014. Orders placed. Patient agrees with treatment plan.

## 2014-01-13 NOTE — Addendum Note (Signed)
Addended by: Gunnar FusiKEMP, Guled Gahan A on: 01/13/2014 12:31 PM   Modules accepted: Orders

## 2014-01-13 NOTE — Telephone Encounter (Signed)
This encounter was created in error - please disregard.

## 2014-01-13 NOTE — Telephone Encounter (Signed)
New message         Pt returning katy's call

## 2014-04-07 ENCOUNTER — Other Ambulatory Visit: Payer: Self-pay | Admitting: *Deleted

## 2014-04-07 ENCOUNTER — Other Ambulatory Visit (INDEPENDENT_AMBULATORY_CARE_PROVIDER_SITE_OTHER): Payer: Medicare HMO | Admitting: *Deleted

## 2014-04-07 DIAGNOSIS — E785 Hyperlipidemia, unspecified: Secondary | ICD-10-CM

## 2014-04-07 LAB — ALT: ALT: 29 U/L (ref 0–53)

## 2014-04-07 LAB — LIPID PANEL
CHOLESTEROL: 155 mg/dL (ref 0–200)
HDL: 35.4 mg/dL — ABNORMAL LOW (ref >39.00)
NonHDL: 119.6
TRIGLYCERIDES: 217 mg/dL — AB (ref 0.0–149.0)
Total CHOL/HDL Ratio: 4
VLDL: 43.4 mg/dL — ABNORMAL HIGH (ref 0.0–40.0)

## 2014-04-07 LAB — LDL CHOLESTEROL, DIRECT: LDL DIRECT: 106 mg/dL

## 2014-04-07 MED ORDER — EZETIMIBE 10 MG PO TABS: 10.0000 mg | ORAL_TABLET | Freq: Every day | ORAL | Status: AC

## 2014-06-04 ENCOUNTER — Other Ambulatory Visit: Payer: Medicare HMO

## 2014-10-29 ENCOUNTER — Encounter: Payer: Medicare HMO | Attending: Family Medicine | Admitting: *Deleted

## 2014-10-29 ENCOUNTER — Encounter: Payer: Self-pay | Admitting: *Deleted

## 2014-10-29 VITALS — Ht 72.0 in | Wt 183.0 lb

## 2014-10-29 DIAGNOSIS — E119 Type 2 diabetes mellitus without complications: Secondary | ICD-10-CM | POA: Diagnosis present

## 2014-10-29 DIAGNOSIS — Z794 Long term (current) use of insulin: Secondary | ICD-10-CM | POA: Insufficient documentation

## 2014-10-29 DIAGNOSIS — Z713 Dietary counseling and surveillance: Secondary | ICD-10-CM | POA: Insufficient documentation

## 2014-10-29 NOTE — Progress Notes (Signed)
Diabetes Self-Management Education  Visit Type: First/Initial  Appt. Start Time: 0930 Appt. End Time: 1030  10/29/2014  Joel Santiago, identified by name and date of birth, is a 74 y.o. male with a diagnosis of Diabetes: Type 2. Joel Santiago was a challenging patient to work with. He is frail, hard of hearing, has poor memory/ historian who presents by himself. I asked what he would do with the information I provided him with he said he would put it away some place and no one else would look at it. He got up late this morning, left home without eating breakfast or taking his Toujeo.  ASSESSMENT  Height 6' (1.829 m), weight 183 lb (83.008 kg). Body mass index is 24.81 kg/(m^2).      Diabetes Self-Management Education - 10/29/14 0949    Visit Information   Visit Type First/Initial   Initial Visit   Diabetes Type Type 2   Are you currently following a meal plan? No   Are you taking your medications as prescribed? Yes   Health Coping   How would you rate your overall health? Good   Psychosocial Assessment   Patient Belief/Attitude about Diabetes Motivated to manage diabetes   Self-care barriers Hard of hearing;Unsteady gait/risk for falls   Self-management support Doctor's office;CDE visits;Family   Other persons present Patient   Patient Concerns Nutrition/Meal planning;Glycemic Control   Special Needs Simplified materials   Preferred Learning Style No preference indicated   Learning Readiness Change in progress   Complications   Last HgB A1C per patient/outside source 11.7 %   How often do you check your blood sugar? 1-2 times/day  FBS today /dl   Number of hypoglycemic episodes per month 0   Have you had a dilated eye exam in the past 12 months? No   Have you had a dental exam in the past 12 months? No   Are you checking your feet? Yes   How many days per week are you checking your feet? 3   Dietary Intake   Breakfast 3/4 C Rice Crispy cereal milk, / bacon and eggs  /    Lunch sandwich, salami, mustard, diet soda   Snack (afternoon) oatmeal cookies X2   Dinner pork chop X1, fish 2 pieces baked, potatoes, rice,   Snack (evening) Goes to bed 1-2AM  potato chips (takes a hand full and puts on a napkin)   Beverage(s) coffee, diet soda, (doesn't like water)    Exercise   Exercise Type ADL's   Patient Education   Previous Diabetes Education No   Nutrition management  Role of diet in the treatment of diabetes and the relationship between the three main macronutrients and blood glucose level;Effects of alcohol on blood glucose and safety factors with consumption of alcohol.;Meal options for control of blood glucose level and chronic complications.   Individualized Goals (developed by patient)   Nutrition General guidelines for healthy choices and portions discussed   Medications take my medication as prescribed   Monitoring  test my blood glucose as discussed   Outcomes   Expected Outcomes Demonstrated limited interest in learning.  Expect minimal changes   Future DMSE PRN   Program Status Completed      Individualized Plan for Diabetes Self-Management Training:   Learning Objective:  Patient will have a greater understanding of diabetes self-management. Patient education plan is to attend individual and/or group sessions per assessed needs and concerns.   Plan:   Patient Instructions  Try Special K  Protein Cereal instead of rice Krispies Only drink diet soda Try not to drink Orange Juice (fruit juice)  Your are doing a pretty good job with the food that you eat. You seems to be making good choices on your portions, not going back for seconds.    Expected Outcomes:  Demonstrated limited interest in learning.  Expect minimal changes  Education material provided: Meal plan card  If problems or questions, patient to contact team via:  Phone  Future DSME appointment: PRN

## 2014-10-29 NOTE — Patient Instructions (Signed)
Try Special K Protein Cereal instead of rice Krispies Only drink diet soda Try not to drink Orange Juice (fruit juice)  Your are doing a pretty good job with the food that you eat. You seems to be making good choices on your portions, not going back for seconds.

## 2016-03-04 ENCOUNTER — Emergency Department (HOSPITAL_COMMUNITY)
Admission: EM | Admit: 2016-03-04 | Discharge: 2016-03-04 | Disposition: A | Discharge status: Home / Self Care | Payer: Medicare Other | Attending: Emergency Medicine | Admitting: Emergency Medicine

## 2016-03-04 ENCOUNTER — Encounter (HOSPITAL_COMMUNITY): Payer: Self-pay | Admitting: Emergency Medicine

## 2016-03-04 ENCOUNTER — Emergency Department (HOSPITAL_COMMUNITY): Payer: Medicare Other

## 2016-03-04 DIAGNOSIS — Z8673 Personal history of transient ischemic attack (TIA), and cerebral infarction without residual deficits: Secondary | ICD-10-CM | POA: Diagnosis not present

## 2016-03-04 DIAGNOSIS — Z87891 Personal history of nicotine dependence: Secondary | ICD-10-CM | POA: Diagnosis not present

## 2016-03-04 DIAGNOSIS — E1165 Type 2 diabetes mellitus with hyperglycemia: Secondary | ICD-10-CM | POA: Insufficient documentation

## 2016-03-04 DIAGNOSIS — R410 Disorientation, unspecified: Secondary | ICD-10-CM | POA: Insufficient documentation

## 2016-03-04 DIAGNOSIS — Z794 Long term (current) use of insulin: Secondary | ICD-10-CM | POA: Diagnosis not present

## 2016-03-04 DIAGNOSIS — Z7982 Long term (current) use of aspirin: Secondary | ICD-10-CM | POA: Insufficient documentation

## 2016-03-04 DIAGNOSIS — I2510 Atherosclerotic heart disease of native coronary artery without angina pectoris: Secondary | ICD-10-CM | POA: Diagnosis not present

## 2016-03-04 DIAGNOSIS — I252 Old myocardial infarction: Secondary | ICD-10-CM | POA: Diagnosis not present

## 2016-03-04 DIAGNOSIS — I119 Hypertensive heart disease without heart failure: Secondary | ICD-10-CM | POA: Diagnosis not present

## 2016-03-04 DIAGNOSIS — R739 Hyperglycemia, unspecified: Secondary | ICD-10-CM

## 2016-03-04 DIAGNOSIS — Z951 Presence of aortocoronary bypass graft: Secondary | ICD-10-CM | POA: Diagnosis not present

## 2016-03-04 DIAGNOSIS — Z79899 Other long term (current) drug therapy: Secondary | ICD-10-CM | POA: Diagnosis not present

## 2016-03-04 DIAGNOSIS — E1151 Type 2 diabetes mellitus with diabetic peripheral angiopathy without gangrene: Secondary | ICD-10-CM | POA: Insufficient documentation

## 2016-03-04 LAB — CBC WITH DIFFERENTIAL/PLATELET
Basophils Absolute: 0 10*3/uL (ref 0.0–0.1)
Basophils Relative: 0 %
EOS ABS: 0.1 10*3/uL (ref 0.0–0.7)
EOS PCT: 1 %
HCT: 41.6 % (ref 39.0–52.0)
HEMOGLOBIN: 13.6 g/dL (ref 13.0–17.0)
LYMPHS ABS: 2.4 10*3/uL (ref 0.7–4.0)
Lymphocytes Relative: 40 %
MCH: 26.6 pg (ref 26.0–34.0)
MCHC: 32.7 g/dL (ref 30.0–36.0)
MCV: 81.3 fL (ref 78.0–100.0)
MONO ABS: 0.7 10*3/uL (ref 0.1–1.0)
Monocytes Relative: 11 %
Neutro Abs: 2.9 10*3/uL (ref 1.7–7.7)
Neutrophils Relative %: 48 %
PLATELETS: 222 10*3/uL (ref 150–400)
RBC: 5.12 MIL/uL (ref 4.22–5.81)
RDW: 13.8 % (ref 11.5–15.5)
WBC: 6 10*3/uL (ref 4.0–10.5)

## 2016-03-04 LAB — BASIC METABOLIC PANEL
ANION GAP: 11 (ref 5–15)
BUN: 12 mg/dL (ref 6–20)
CHLORIDE: 101 mmol/L (ref 101–111)
CO2: 23 mmol/L (ref 22–32)
Calcium: 9.2 mg/dL (ref 8.9–10.3)
Creatinine, Ser: 1.02 mg/dL (ref 0.61–1.24)
GFR calc Af Amer: >60 mL/min (ref >60)
Glucose, Bld: 468 mg/dL — ABNORMAL HIGH (ref 65–99)
POTASSIUM: 4.8 mmol/L (ref 3.5–5.1)
SODIUM: 135 mmol/L (ref 135–145)

## 2016-03-04 LAB — URINALYSIS, ROUTINE W REFLEX MICROSCOPIC
BILIRUBIN URINE: NEGATIVE
HGB URINE DIPSTICK: NEGATIVE
KETONES UR: NEGATIVE mg/dL
LEUKOCYTES UA: NEGATIVE
Nitrite: NEGATIVE
PH: 5 (ref 5.0–8.0)
Protein, ur: NEGATIVE mg/dL
Specific Gravity, Urine: 1.028 (ref 1.005–1.030)
Squamous Epithelial / LPF: NONE SEEN

## 2016-03-04 LAB — CBG MONITORING, ED
GLUCOSE-CAPILLARY: 451 mg/dL — AB (ref 65–99)
Glucose-Capillary: 334 mg/dL — ABNORMAL HIGH (ref 65–99)

## 2016-03-04 MED ORDER — INSULIN ASPART 100 UNIT/ML ~~LOC~~ SOLN
10.0000 [IU] | Freq: Once | SUBCUTANEOUS | Status: AC
  Administered 2016-03-04: 10 [IU] via SUBCUTANEOUS
  Filled 2016-03-04: qty 1

## 2016-03-04 MED ORDER — INSULIN ASPART 100 UNIT/ML FLEXPEN: 8.0000 [IU] | PEN_INJECTOR | Freq: Three times a day (TID) | SUBCUTANEOUS | Status: AC

## 2016-03-04 MED ORDER — SODIUM CHLORIDE 0.9 % IV BOLUS (SEPSIS)
1000.0000 mL | Freq: Once | INTRAVENOUS | Status: AC
  Administered 2016-03-04: 1000 mL via INTRAVENOUS

## 2016-03-04 NOTE — ED Triage Notes (Addendum)
Hyperglycemia x 3 days , has been voiding a lot , wife states it smells, pt has been confused per family, has not been taking his meds per his wife

## 2016-03-04 NOTE — ED Provider Notes (Signed)
[facility]-EMERGENCY DEPT Provider Note   CSN: [id number] Arrival date & time: 03/04/16  1335     History   Chief Complaint Chief Complaint  Patient presents with  . Hyperglycemia    HPI Joel Santiago is a 76 y.o. male. Patient presents from home with his wife and daughter due to hyperglycemia. Today, patient ate a negative donuts, 6 Pepsi's, and cherry pie. The wife reports he has poor compliance with his home insulin regimen. Over the last several months, they report he has had progressive decline in his cognitive status. He is typically not oriented to date or year, but usually knows who his wife is. They report the patient has been urinating more frequently. The wife reports it is also had a foul odor. Otherwise, patient has been a symptomatically. Here, patient denies any complaints. He just states he would like to eat and go home. He is oriented to his name and his wife's name.  HPI  Past Medical History:  Diagnosis Date  . Angina   . Cardiac angina (HCC)   . Coronary artery disease   . Diabetes mellitus   . Hyperlipemia   . Hypertension   . Stroke [facility])     Patient Active Problem List   Diagnosis Date Noted  . Chest pain 01/30/2012  . 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 Hypertensive heart disease without CHF 08/28/2011  . Hyperlipidemia   . Type 2 diabetes mellitus with vascular disease (HCC)   . Non-STEMI (non-ST elevated myocardial infarction) (HCC) 08/27/2011  . CAD (coronary artery disease) 02/28/2011    Past Surgical History:  Procedure Laterality Date  . CORONARY ARTERY BYPASS GRAFT  03/01/2011   Procedure: CORONARY ARTERY BYPASS GRAFTING (CABG);  Surgeon: Alleen Borne, MD;  Location: [facility] OR;  Service: Open Heart Surgery;  Laterality: N/A;  Coronary artery bypass graft times five on pump using left internal mammary artery and right greater saphenous vein via endovein harvest.  . LEFT HEART CATH N/A 08/26/2011   Procedure: LEFT HEART CATH;   Surgeon: Corky Crafts, MD;  Location: [facility] CATH LAB;  Service: Cardiovascular;  Laterality: N/A;  . LEFT HEART CATHETERIZATION WITH CORONARY ANGIOGRAM Bilateral 02/28/2011   Procedure: LEFT HEART CATHETERIZATION WITH CORONARY ANGIOGRAM;  Surgeon: Quintella Reichert, MD;  Location: [facility] CATH LAB;  Service: Cardiovascular;  Laterality: Bilateral;       Home Medications    Prior to Admission medications   Medication Sig Start Date End Date Taking? Authorizing Provider  aspirin 325 MG tablet Take 325 mg by mouth daily.   Yes Historical Provider, MD  ezetimibe (ZETIA) 10 MG tablet Take 1 tablet (10 mg total) by mouth daily. 04/07/14  Yes Quintella Reichert, MD  fish oil-omega-3 fatty acids 1000 MG capsule Take 1 capsule by mouth daily.    Yes Historical Provider, MD  Insulin Glargine (TOUJEO SOLOSTAR) 300 UNIT/ML SOPN Inject 30 Units into the skin at bedtime.    Yes Historical Provider, MD  metFORMIN (GLUCOPHAGE) 1000 MG tablet Take 1,000 mg by mouth 2 (two) times daily with a meal.    Yes Historical Provider, MD  metoprolol (LOPRESSOR) 50 MG tablet Take 0.5 tablets (25 mg total) by mouth 2 (two) times daily. Patient taking differently: Take 50 mg by mouth 2 (two) times daily.  12/26/11  Yes Adeline Joselyn Glassman, MD  nitroGLYCERIN (NITROSTAT) 0.4 MG SL tablet Place 0.4 mg under the tongue every 5 (five) minutes as needed for chest pain.  Yes Historical Provider, MD  insulin aspart (NOVOLOG FLEXPEN) 100 UNIT/ML FlexPen Inject 8 Units into the skin 3 (three) times daily with meals. 03/04/16   Lennette Bihari, MD  Ticagrelor (BRILINTA) 90 MG TABS tablet Take 1 tablet (90 mg total) by mouth 2 (two) times daily. Patient not taking: Reported on 03/04/2016 08/28/11   Othella Boyer, MD    Family History Family History  Problem Relation Age of Onset  . CAD Father   . CAD Mother     Social History Social History  Substance Use Topics  . Smoking status: Former Smoker    Packs/day: 0.50    Types: Cigarettes     Quit date: 08/25/2011  . Smokeless tobacco: Never Used  . Alcohol use 5.4 oz/week    9 Shots of liquor per week     Allergies   Patient has no known allergies.   Review of Systems Review of Systems  Constitutional: Negative for chills and fever.  HENT: Negative for ear pain and sore throat.   Eyes: Negative for pain and visual disturbance.  Respiratory: Negative for cough and shortness of breath.   Cardiovascular: Negative for chest pain and palpitations.  Gastrointestinal: Negative for abdominal pain and vomiting.  Genitourinary: Positive for frequency. Negative for dysuria and hematuria.  Musculoskeletal: Negative for arthralgias and back pain.  Skin: Negative for color change and rash.  Neurological: Negative for seizures and syncope.  All other systems reviewed and are negative.    Physical Exam Updated Vital Signs BP 137/81   Pulse 70   Temp 98.5 F (36.9 C) (Oral)   Resp 18   SpO2 97%   Physical Exam  Constitutional: He appears well-developed and well-nourished.  HENT:  Head: Normocephalic and atraumatic.  Eyes: Conjunctivae are normal.  Neck: Neck supple.  Cardiovascular: Normal rate and regular rhythm.   No murmur heard. Pulmonary/Chest: Effort normal and breath sounds normal. No respiratory distress.  Abdominal: Soft. There is no tenderness.  Musculoskeletal: He exhibits no edema.  Neurological: He is alert.  Oriented to name and wife's name. Disoriented to date, event. 5/5 strength in all extremities. Sensation to light touch intact throughout. No CN deficit.  Skin: Skin is warm and dry.  Psychiatric: He has a normal mood and affect.  Nursing note and vitals reviewed.    ED Treatments / Results  Labs (all labs ordered are listed, but only abnormal results are displayed) Labs Reviewed  BASIC METABOLIC PANEL - Abnormal; Notable for the following:       Result Value   Glucose, Bld 468 (*)    All other components within normal limits  CBG  MONITORING, ED - Abnormal; Notable for the following:    Glucose-Capillary 451 (*)    All other components within normal limits  CBG MONITORING, ED - Abnormal; Notable for the following:    Glucose-Capillary 334 (*)    All other components within normal limits  CBC WITH DIFFERENTIAL/PLATELET  URINALYSIS, ROUTINE W REFLEX MICROSCOPIC  CBG MONITORING, ED    EKG  EKG Interpretation None       Radiology Ct Head Wo Contrast  Result Date: 03/04/2016 CLINICAL DATA:  Hyperglycemia for 3 days. Confusion. History of hypertension, diabetes. EXAM: CT HEAD WITHOUT CONTRAST TECHNIQUE: Contiguous axial images were obtained from the base of the skull through the vertex without intravenous contrast. COMPARISON:  CT HEAD October 15, 2013 FINDINGS: BRAIN: No intraparenchymal hemorrhage, mass effect, midline shift or acute large vascular territory infarcts. Moderate to severe  ventriculomegaly, similar to prior examination on the basis of global parenchymal brain volume loss. Old RIGHT basal ganglia lacunar infarct. Confluent similar white matter hypodensities. No abnormal extra-axial fluid collections. Basal cisterns are patent. VASCULAR: Moderate calcific atherosclerosis of the carotid siphons. SKULL: No skull fracture. No significant scalp soft tissue swelling. SINUSES/ORBITS: The mastoid air-cells and included paranasal sinuses are well-aerated.The included ocular globes and orbital contents are non-suspicious. OTHER: Patient is edentulous. IMPRESSION: No acute intracranial process. Stable examination including moderate to severe global brain atrophy, RIGHT basal ganglia lacunar infarct and and moderate to severe chronic small vessel ischemic disease. Electronically Signed   By: Awilda Metroourtnay  Bloomer M.D.   On: 03/04/2016 19:11    Procedures Procedures (including critical care time)  Medications Ordered in ED Medications  sodium chloride 0.9 % bolus 1,000 mL (0 mLs Intravenous Stopped 03/04/16 1842)  insulin  aspart (novoLOG) injection 10 Units (10 Units Subcutaneous Given 03/04/16 1840)     Initial Impression / Assessment and Plan / ED Course  I have reviewed the triage vital signs and the nursing notes.  Pertinent labs & imaging results that were available during my care of the patient were reviewed by me and considered in my medical decision making (see chart for details).    Patient is a 76 year old male with history as above who presents with hyperglycemia. Patient has been intermittently compliant with his insulin therapy at home. He has also been eating poor diet, many sweets. He has had several months of progressive cognitive decline. He lives at home with his wife. He presented here today with his wife and his daughter. They're concerned about his glucose being over 500 home. Labs obtained here revealed hyperglycemia to 468. No evidence of DKA. Creatinine is slightly elevated from baseline, 1.02 today with baseline around 0.7. Given his chronic confusion, CT head was performed to rule out any intracranial abnormality. CT scan shows chronic changes, but nothing acute. Given the history from his family, I suspect his progressive cognitive decline is consistent with dementia. Patient's wife states that she will begin administering patient's insulin. Patient last saw his family physician in November 2017. I advised following up with PCP within one week. Pt discharged in stable condition.  Final Clinical Impressions(s) / ED Diagnoses   Final diagnoses:  Hyperglycemia    New Prescriptions Current Discharge Medication List       Lennette BihariJohn Michael Ricard Faulkner, MD 03/04/16 1928    Charlynne Panderavid Hsienta Yao, MD 03/04/16 [phone]

## 2016-03-04 NOTE — ED Notes (Signed)
Pt changed into paper scrubs for discharge.

## 2016-03-29 ENCOUNTER — Ambulatory Visit (INDEPENDENT_AMBULATORY_CARE_PROVIDER_SITE_OTHER): Payer: Medicare Other | Admitting: Podiatry

## 2016-03-29 ENCOUNTER — Encounter: Payer: Self-pay | Admitting: Podiatry

## 2016-03-29 VITALS — Ht 72.0 in | Wt 184.0 lb

## 2016-03-29 DIAGNOSIS — M79672 Pain in left foot: Secondary | ICD-10-CM

## 2016-03-29 DIAGNOSIS — M79671 Pain in right foot: Secondary | ICD-10-CM

## 2016-03-29 DIAGNOSIS — B351 Tinea unguium: Secondary | ICD-10-CM | POA: Diagnosis not present

## 2016-03-29 NOTE — Progress Notes (Signed)
SUBJECTIVE: 76 y.o. year old IDDM male presents wearing bedroom slippers requesting toe nails be trimmed. Nails are too thick and hurts in shoes. Blood sugar has been running between 200 and 400. Was in hospital 2 month ago for uncontrolled blood sugar, diabetic crisis.  Patient was accompanied by his wife.  REVIEW OF SYSTEMS: Pertinent items noted in HPI and remainder of comprehensive ROS otherwise negative.  OBJECTIV DERMATOLOGIC EXAMINATION:   VASCULAR EXAMINATION OF LOWER LIMBS: Pedal pulses are not palpable on both DP and PT bilateral. No edema or erythema noted. Temperature gradient from tibial crest to dorsum of foot is within normal bilateral.  NEUROLOGIC EXAMINATION OF THE LOWER LIMBS: Achilles DTR is present and within normal. Monofilament (Semmes-Weinstein 10-gm) sensory testing positive 6 out of 6, bilateral. Vibratory sensations(128Hz  turning fork) intact on left and failed in right.  Sharp and Dull discriminatory sensations at the plantar ball of hallux is intact bilateral.   MUSCULOSKELETAL EXAMINATION: Positive for severe bunion deformities bilateral.  ASSESSMENT: Non controlled IDDM. Onychomycosis x 10. Painful feet.  PLAN: Reviewed clinical findings and available treatment options. All nails debrided. Reviewed importance of controlled blood sugar and limitation of sweets in diet.  Return in 3 months or as needed.

## 2016-03-29 NOTE — Patient Instructions (Signed)
Seen for hypertrophic nails. All nails debrided. Return in 3 months or as needed.  

## 2016-10-17 ENCOUNTER — Encounter: Payer: Self-pay | Admitting: Podiatry

## 2016-10-17 ENCOUNTER — Ambulatory Visit (INDEPENDENT_AMBULATORY_CARE_PROVIDER_SITE_OTHER): Payer: Medicare Other | Admitting: Podiatry

## 2016-10-17 DIAGNOSIS — B351 Tinea unguium: Secondary | ICD-10-CM | POA: Diagnosis not present

## 2016-10-17 DIAGNOSIS — M79671 Pain in right foot: Secondary | ICD-10-CM

## 2016-10-17 DIAGNOSIS — M79672 Pain in left foot: Secondary | ICD-10-CM

## 2016-10-17 NOTE — Patient Instructions (Signed)
Seen for hypertrophic nails. All nails debrided. Return in 3 months or as needed.  

## 2016-10-17 NOTE — Progress Notes (Signed)
SUBJECTIVE: 76 y.o. year old IDDM male presents wearing bedroom slippers requesting toe nails be trimmed. Nails are too thick and hurts in shoes. Does not know blood sugar level.   Positive history of uncontrolled blood sugar, diabetic crisis.  Patient was accompanied by his wife.  OBJECTIV DERMATOLOGIC EXAMINATION: Thick dystrophic and discolored nails x 10.  VASCULAR EXAMINATION OF LOWER LIMBS: Pedal pulses are not palpable on both DP and PT bilateral. No edema or erythema noted. Temperature gradient from tibial crest to dorsum of foot is within normal bilateral.  NEUROLOGIC EXAMINATION OF THE LOWER LIMBS: Achilles DTR is present and within normal. Monofilament (Semmes-Weinstein 10-gm) sensory testing positive 6 out of 6, bilateral. Vibratory sensations(128Hz  turning fork) intact on left and failed in right.  Sharp and Dull discriminatory sensations at the plantar ball of hallux is intact bilateral.   MUSCULOSKELETAL EXAMINATION: Positive for severe bunion deformities bilateral.  ASSESSMENT: Non controlled IDDM. Onychomycosis x 10. Painful feet.  PLAN: Reviewed clinical findings and available treatment options. All nails debrided. Reviewed importance of controlled blood sugar and limitation of sweets in diet.  Return in 3 months or as needed.

## 2017-01-25 ENCOUNTER — Ambulatory Visit: Payer: Medicare Other | Admitting: Podiatry

## 2017-05-07 IMAGING — CT CT HEAD W/O CM
3 of 4 series · 17 of 47 positions shown, 20 images · non-contrast
Comparison: CT HEAD October 15, 2013

CLINICAL DATA: Hyperglycemia for 3 days. Confusion. History of
hypertension, diabetes.

EXAM:
CT HEAD WITHOUT CONTRAST
TECHNIQUE: Contiguous axial images were obtained from the base of the skull
through the vertex without intravenous contrast.

[Series 201: head w/o, idose (1) · axial · non-contrast · 0.49mm/px · z∈[+124,+249]mm · 11 of 31 slices shown, 14 images]
[im 3/31  brain]
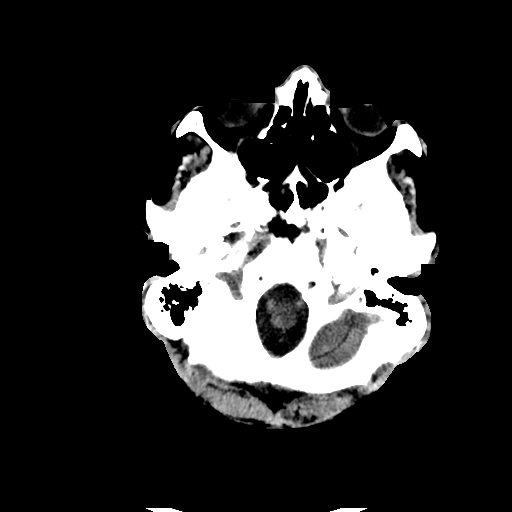
[im 3/31  bone]
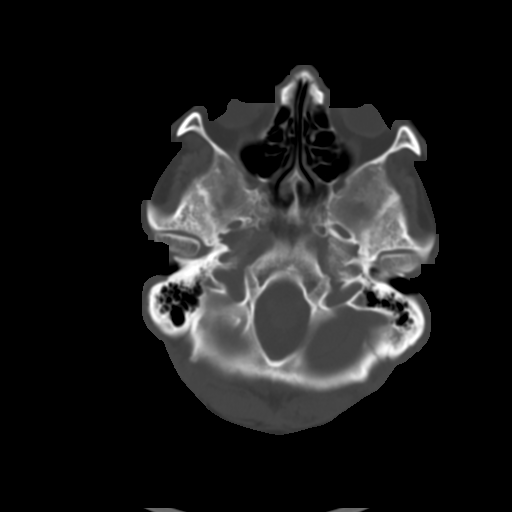
[im 5/31  brain]
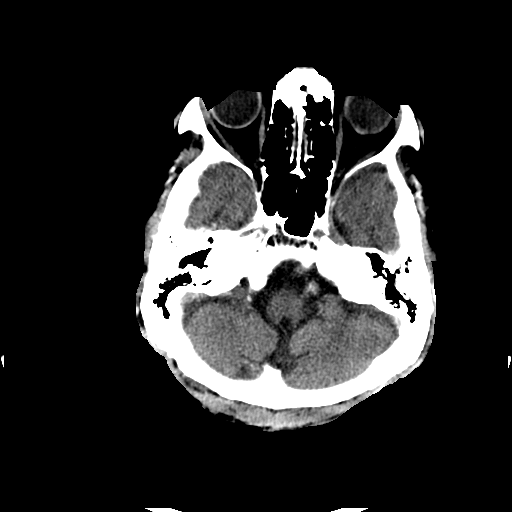
[im 7/31  brain]
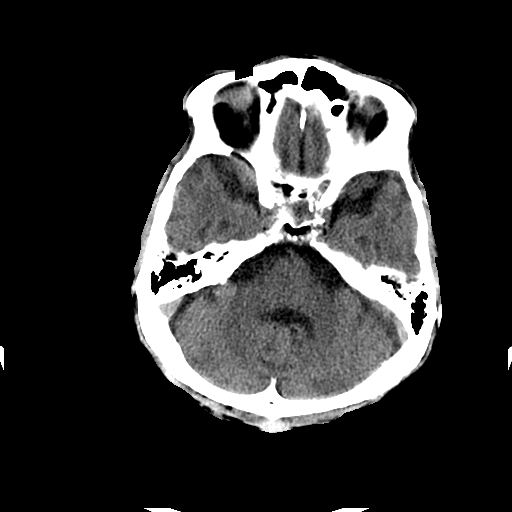
[im 11/31  brain]
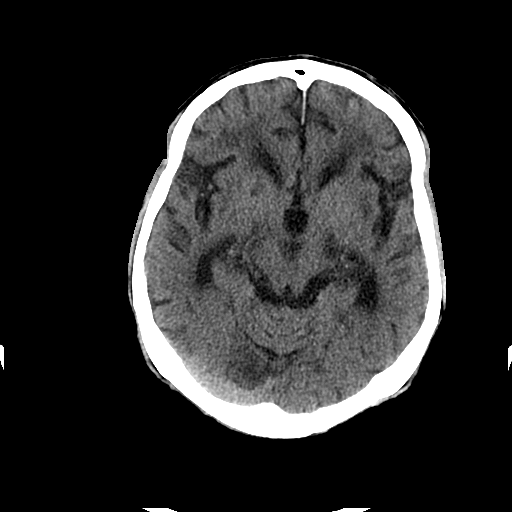
[im 13/31  brain]
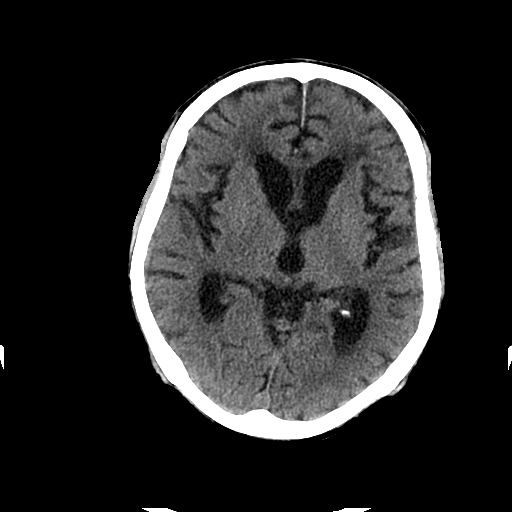
[im 13/31  bone]
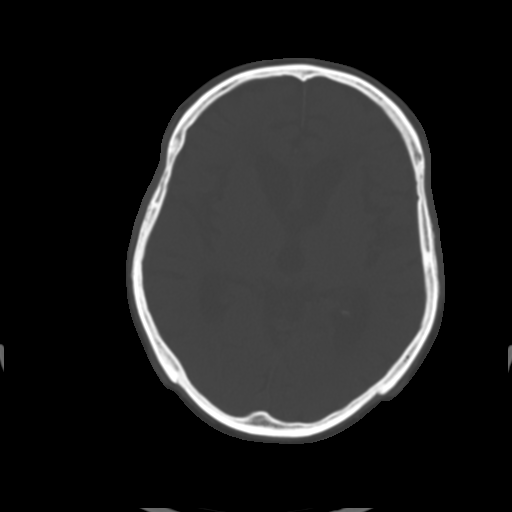
[im 16/31  brain]
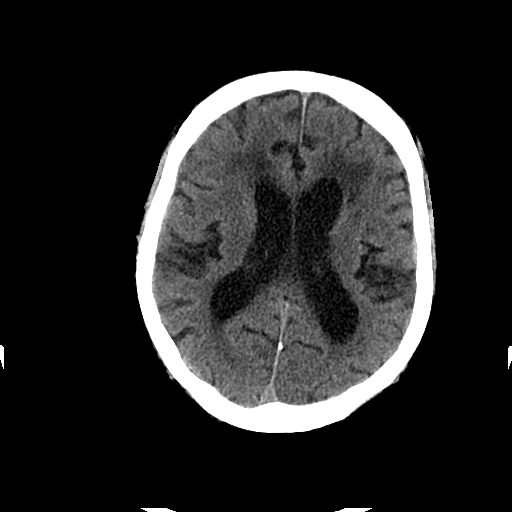
[im 18/31  brain]
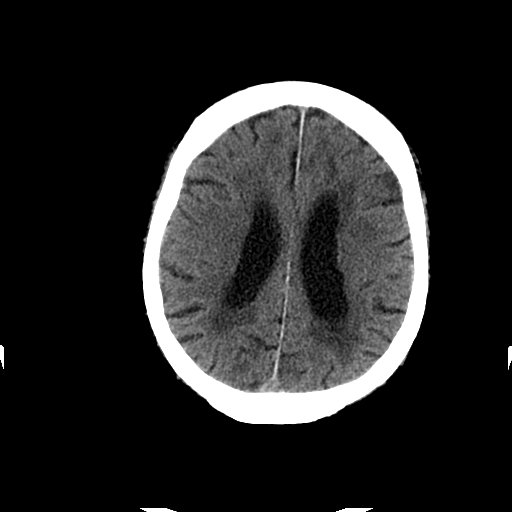
[im 20/31  brain]
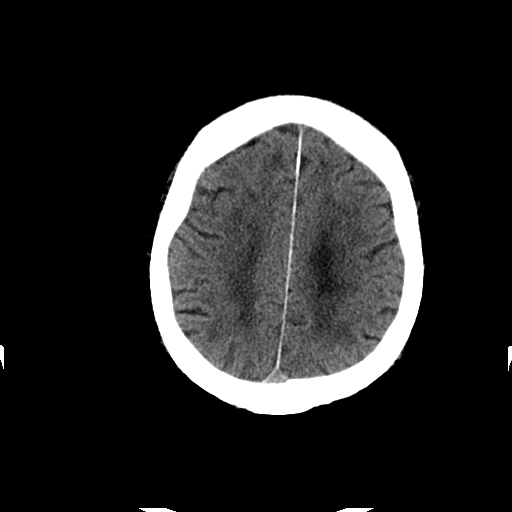
[im 24/31  brain]
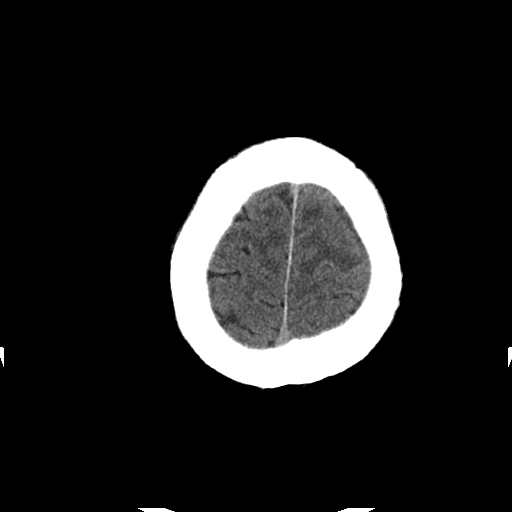
[im 24/31  bone]
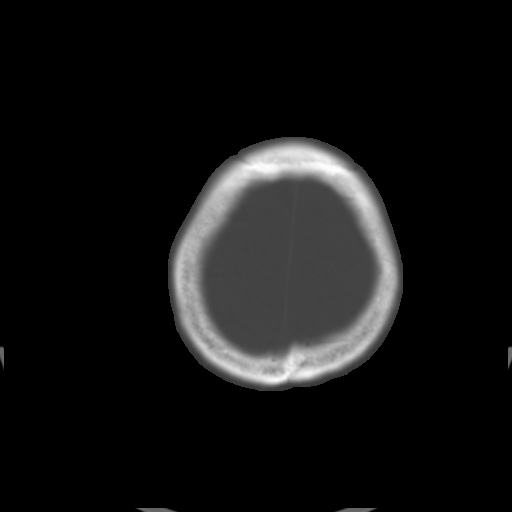
[im 26/31  brain]
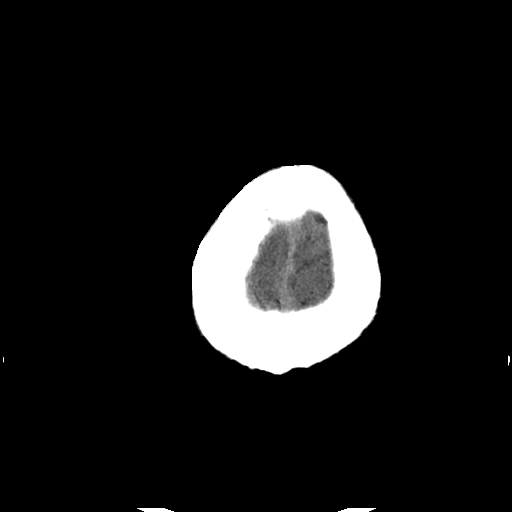
[im 28/31  brain]
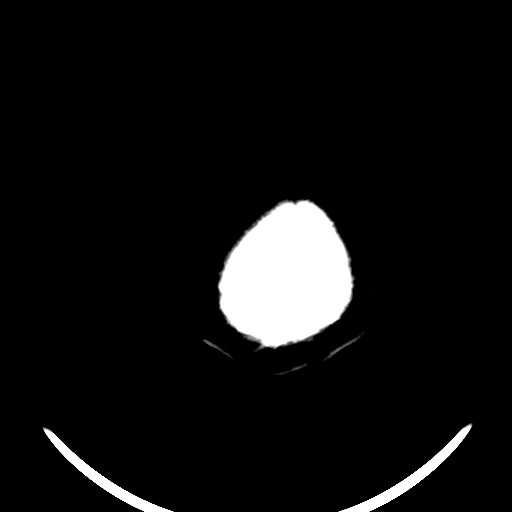

[Series 203: coronal st, idose (1) · coronal · 0.40mm/px · 3 of 76 slices shown]
[im 26/76  brain]
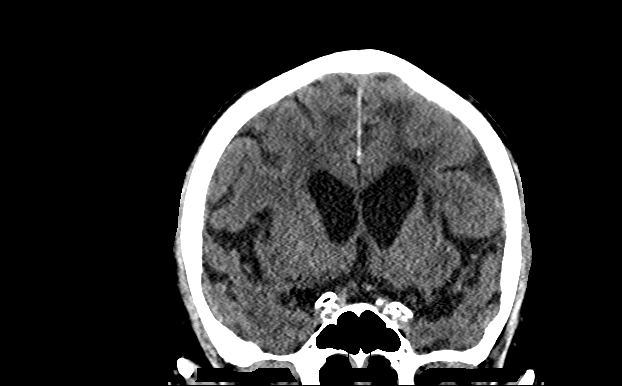
[im 34/76  brain]
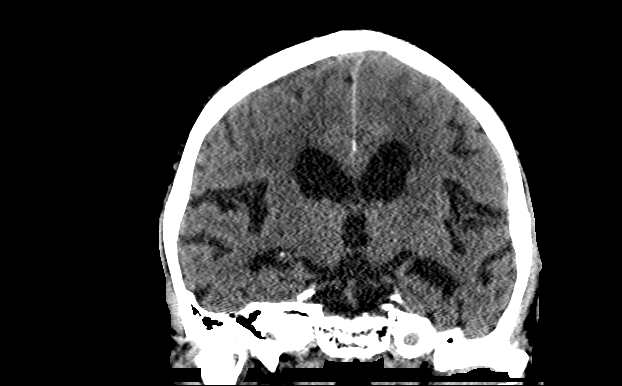
[im 42/76  brain]
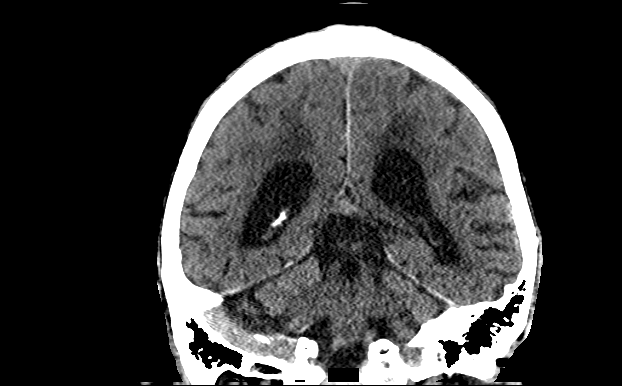

[Series 204: sagittal st, idose (1) · sagittal · 0.40mm/px · 3 of 83 slices shown]
[im 28/83  brain]
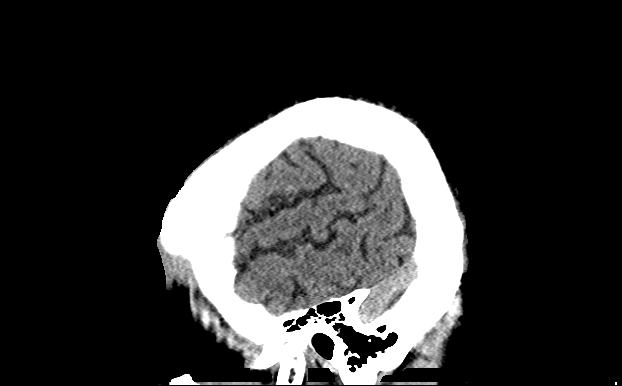
[im 42/83  brain]
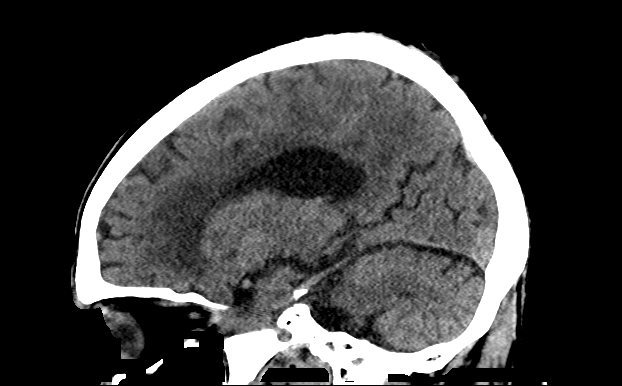
[im 55/83  brain]
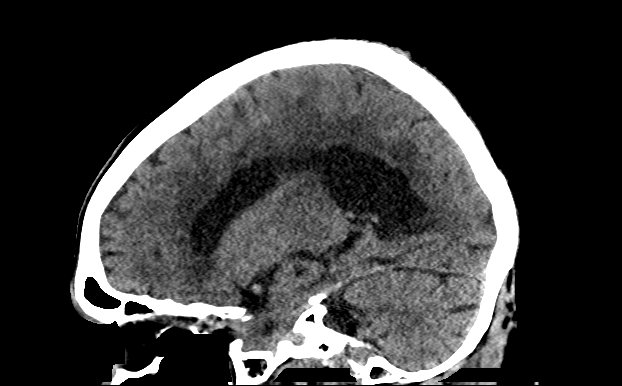

[17 of 47 positions shown; findings below may reference images not displayed]

FINDINGS: BRAIN: No intraparenchymal hemorrhage, mass effect, midline shift or
acute large vascular territory infarcts. Moderate to severe
ventriculomegaly, similar to prior examination on the basis of
global parenchymal brain volume loss. Old RIGHT basal ganglia
lacunar infarct. Confluent similar white matter hypodensities. No
abnormal extra-axial fluid collections. Basal cisterns are patent.

VASCULAR: Moderate calcific atherosclerosis of the carotid siphons.

SKULL: No skull fracture. No significant scalp soft tissue swelling.

SINUSES/ORBITS: The mastoid air-cells and included paranasal sinuses
are well-aerated.The included ocular globes and orbital contents are
non-suspicious.

OTHER: Patient is edentulous.
IMPRESSION: No acute intracranial process.

Stable examination including moderate to severe global brain
atrophy, RIGHT basal ganglia lacunar infarct and and moderate to
severe chronic small vessel ischemic disease.

## 2019-03-25 DEATH — deceased
